# Patient Record
Sex: Female | Born: 1985 | Race: Black or African American | Hispanic: No | Smoking: Never smoker
Health system: Southern US, Community
[De-identification: ages and names within clinical notes are randomized; demographics above are authoritative.]

## PROBLEM LIST (undated history)

## (undated) DIAGNOSIS — Z789 Other specified health status: Secondary | ICD-10-CM

## (undated) HISTORY — PX: NO PAST SURGERIES: SHX2092

---

## 2013-07-05 ENCOUNTER — Emergency Department (INDEPENDENT_AMBULATORY_CARE_PROVIDER_SITE_OTHER)
Admission: EM | Admit: 2013-07-05 | Discharge: 2013-07-05 | Disposition: A | Discharge status: Home / Self Care | Payer: Self-pay | Source: Home / Self Care | Attending: Family Medicine | Admitting: Family Medicine

## 2013-07-05 ENCOUNTER — Other Ambulatory Visit (HOSPITAL_COMMUNITY)
Admission: RE | Admit: 2013-07-05 | Discharge: 2013-07-05 | Disposition: A | Discharge status: Home / Self Care | Payer: Self-pay | Source: Ambulatory Visit | Attending: Family Medicine | Admitting: Family Medicine

## 2013-07-05 DIAGNOSIS — N760 Acute vaginitis: Secondary | ICD-10-CM | POA: Insufficient documentation

## 2013-07-05 DIAGNOSIS — Z113 Encounter for screening for infections with a predominantly sexual mode of transmission: Secondary | ICD-10-CM | POA: Insufficient documentation

## 2013-07-05 LAB — RPR

## 2013-07-05 LAB — POCT PREGNANCY, URINE: Preg Test, Ur: NEGATIVE

## 2013-07-05 LAB — HIV ANTIBODY (ROUTINE TESTING W REFLEX): HIV: NONREACTIVE

## 2013-07-05 MED ORDER — LIDOCAINE HCL (PF) 1 % IJ SOLN
INTRAMUSCULAR | Status: AC
  Filled 2013-07-05: qty 5

## 2013-07-05 MED ORDER — AZITHROMYCIN 250 MG PO TABS
1000.0000 mg | ORAL_TABLET | Freq: Once | ORAL | Status: AC
  Administered 2013-07-05: 1000 mg via ORAL

## 2013-07-05 MED ORDER — CEFTRIAXONE SODIUM 250 MG IJ SOLR
250.0000 mg | Freq: Once | INTRAMUSCULAR | Status: AC
  Administered 2013-07-05: 250 mg via INTRAMUSCULAR

## 2013-07-05 MED ORDER — CEFTRIAXONE SODIUM 250 MG IJ SOLR
INTRAMUSCULAR | Status: AC
  Filled 2013-07-05: qty 250

## 2013-07-05 MED ORDER — AZITHROMYCIN 250 MG PO TABS
ORAL_TABLET | ORAL | Status: AC
  Filled 2013-07-05: qty 4

## 2013-07-05 NOTE — ED Provider Notes (Signed)
Mary Mckinney is a 28 y.o. female who presents to Urgent Care today for Chlamydia. Patient's ex-boyfriend recently called her and told her that he tested positive for Chlamydia. She is completely asymptomatic and feels well. She would like testing and treatment today.   No past medical history on file. History  Substance Use Topics  . Smoking status: Not on file  . Smokeless tobacco: Not on file  . Alcohol Use: Not on file   ROS as above Medications: No current facility-administered medications for this encounter.   No current outpatient prescriptions on file.    Exam:  BP 144/81  Pulse 59  Temp(Src) 98.8 F (37.1 C) (Oral)  Resp 18  SpO2 100% Gen: Well NAD   Results for orders placed during the hospital encounter of 07/05/13 (from the past 24 hour(s))  POCT PREGNANCY, URINE     Status: None   Collection Time    07/05/13 12:59 PM      Result Value Ref Range   Preg Test, Ur NEGATIVE  NEGATIVE   No results found.  Assessment and Plan: 28 y.o. female with Chlamydia. Plan to treat with azithromycin and ceftriaxone. Urine cytology pending. HIV and RPR pending  Discussed warning signs or symptoms. Please see discharge instructions. Patient expresses understanding.    Gregor Hams, MD 07/05/13 [phone]

## 2013-07-05 NOTE — Discharge Instructions (Signed)
Thank you for coming in today. We will call you if anything is positive.  If your belly pain worsens, or you have high fever, bad vomiting, blood in your stool or black tarry stool go to the Emergency Room.   Chlamydia Test This a test to see if you have Chlamydia which is a common sexually transmitted disease (STD). You get this disease by having sexual contact (oral, vaginal, or anal) with an infected person. An infected mother can spread the disease to her baby during childbirth. If this test is positive, you have a sexually transmitted disease (STD). DO NOT have unprotected sex until the treatment is complete and you have been retested and are negative. PREPARATION FOR TEST Your caregiver will use a swab to take a sample. The swab may be from the cervix, urethra, penis, anus, or throat. It may be possible to use a urine sample, if the lab where the sample is sent is able to test urine for this disease. NORMAL FINDINGS  No Growth  Antibodies: less than 1:640 Ranges for normal findings may vary among different laboratories and hospitals. You should always check with your doctor after having lab work or other tests done to discuss the meaning of your test results and whether your values are considered within normal limits. MEANING OF TEST  Your caregiver will go over the test results with you and discuss the importance and meaning of your results, as well as treatment options and the need for additional tests if necessary. OBTAINING THE TEST RESULTS It is your responsibility to obtain your test results. Ask the lab or department performing the test when and how you will get your results. Document Released: 02/06/2004 Document Revised: 04/07/2011 Document Reviewed: 12/23/2007 [facility] Patient Information 2014 [place], [place].

## 2013-07-07 ENCOUNTER — Telehealth (HOSPITAL_COMMUNITY): Payer: Self-pay | Admitting: *Deleted

## 2013-09-07 ENCOUNTER — Emergency Department (HOSPITAL_COMMUNITY)
Admission: EM | Admit: 2013-09-07 | Discharge: 2013-09-07 | Disposition: A | Discharge status: Home / Self Care | Payer: BC Managed Care – PPO | Attending: Emergency Medicine | Admitting: Emergency Medicine

## 2013-09-07 ENCOUNTER — Encounter (HOSPITAL_COMMUNITY): Payer: Self-pay | Admitting: Emergency Medicine

## 2013-09-07 DIAGNOSIS — L03115 Cellulitis of right lower limb: Secondary | ICD-10-CM

## 2013-09-07 DIAGNOSIS — L03119 Cellulitis of unspecified part of limb: Principal | ICD-10-CM

## 2013-09-07 DIAGNOSIS — R21 Rash and other nonspecific skin eruption: Secondary | ICD-10-CM | POA: Diagnosis present

## 2013-09-07 DIAGNOSIS — L02419 Cutaneous abscess of limb, unspecified: Secondary | ICD-10-CM | POA: Diagnosis not present

## 2013-09-07 MED ORDER — DOXYCYCLINE HYCLATE 100 MG PO CAPS: 100.0000 mg | ORAL_CAPSULE | Freq: Two times a day (BID) | ORAL | Status: DC

## 2013-09-07 NOTE — ED Provider Notes (Signed)
CSN: [id number]     Arrival date & time 09/07/13  0433 History   First MD Initiated Contact with Patient 09/07/13 [phone]     Chief Complaint  Patient presents with  . Insect Bite      Patient is a 28 y.o. female presenting with rash. The history is provided by the patient.  Rash Location:  Leg Leg rash location:  R lower leg Severity:  Mild Onset quality:  Gradual Duration:  1 day Timing:  Constant Progression:  Worsening Chronicity:  New Relieved by:  Nothing Worsened by:  Nothing tried Associated symptoms: no fever and no shortness of breath   pt reports right calf pain/redness for past 24 hours She reports it is also "itching" She thinks it is a spider bite  She has no medical problems.  No h/o DVT/PE   PMH - none  History  Substance Use Topics  . Smoking status: Never Smoker   . Smokeless tobacco: Never Used  . Alcohol Use: No   OB History   Grav Para Term Preterm Abortions TAB SAB Ect Mult Living                 Review of Systems  Constitutional: Negative for fever.  Respiratory: Negative for shortness of breath.   Cardiovascular: Negative for chest pain.  Skin: Positive for rash.      Allergies  Review of patient's allergies indicates no known allergies.  Home Medications   Prior to Admission medications   Medication Sig Start Date End Date Taking? Authorizing Provider  doxycycline (VIBRAMYCIN) 100 MG capsule Take 1 capsule (100 mg total) by mouth 2 (two) times daily. 09/07/13   Sharyon Cable, MD   BP 129/66  Pulse 78  Temp(Src) 98.3 F (36.8 C) (Oral)  Resp 18  SpO2 97%  LMP 08/27/2013 Physical Exam CONSTITUTIONAL: Well developed/well nourished HEAD: Normocephalic/atraumatic ENMT: Mucous membranes moist NECK: supple no meningeal signs CV: S1/S2 noted, no murmurs/rubs/gallops noted LUNGS: Lungs are clear to auscultation bilaterally, no apparent distress ABDOMEN: soft, nontender, no rebound or guarding NEURO: Pt is awake/alert, moves  all extremitiesx4 EXTREMITIES: pulses normal, full ROM Patch of erythema to posterior calf.  No crepitus.  No streaking.  No abscess.  No induration.  No streaking.  Mild tenderness to area of rash.  No other calf tenderness SKIN: warm, color normal PSYCH: no abnormalities of mood noted  ED Course  Procedures   Suspect early cellulitis, though could also have some allergic features I doubt acute DVT We discussed strict return precautions  MDM   Final diagnoses:  Cellulitis of right lower extremity    Nursing notes including past medical history and social history reviewed and considered in documentation     Sharyon Cable, MD 09/07/13 [phone]

## 2013-09-07 NOTE — ED Notes (Signed)
Pt states her rt calf started swelling yesterday morning and she thinks it is a spider bite. Leg is swollen and red and warm to the touch on the rt calf.

## 2015-01-18 LAB — OB RESULTS CONSOLE ABO/RH: RH Type: POSITIVE

## 2015-01-18 LAB — OB RESULTS CONSOLE GC/CHLAMYDIA
Chlamydia: NEGATIVE
GC PROBE AMP, GENITAL: NEGATIVE

## 2015-01-18 LAB — OB RESULTS CONSOLE HIV ANTIBODY (ROUTINE TESTING): HIV: NONREACTIVE

## 2015-01-18 LAB — OB RESULTS CONSOLE HEPATITIS B SURFACE ANTIGEN: HEP B S AG: NEGATIVE

## 2015-01-18 LAB — OB RESULTS CONSOLE ANTIBODY SCREEN: ANTIBODY SCREEN: NEGATIVE

## 2015-01-18 LAB — OB RESULTS CONSOLE RUBELLA ANTIBODY, IGM: RUBELLA: IMMUNE

## 2015-01-18 LAB — OB RESULTS CONSOLE RPR: RPR: NONREACTIVE

## 2015-01-28 NOTE — L&D Delivery Note (Signed)
Final Labor Progress Note At 0320 pt reports an increased in rectal pressure.  VE C/C/+2.  FHR remained reassuring with early decelerations.  Vaginal Delivery Note The pt utilized an epidural as pain management.  Artificial rupture of membranes on 06/20/15, at 1232, light leconium.  GBS was positive, PCN x 5 doses were given.  Cervical dilation was complete at  0320.  NICHD Category 1.    Pushing with guidance began at  0325.   After 19 minutes of pushing the head, shoulders and the body of a viable female infant "[place]" delivered spontaneously with maternal effort and compound right hand in the ROA position at 0344.       Mild shoulder, was relieved with gentle outward traction, maternal effort, the head of the bed lowered and [place] and suprapubic pressure applied. .    Loose  Body cord x1 infant somersaulted thru without difficulty.   With vigorous tone and spontaneous cry, the infant was placed on moms abd.  After the umbilical cord was clamped it was cut by the FOB, then cord blood was obtained for evaluation.  Spontaneous delivery of a intact placenta with a 3 vessel cord via [place] at  [phone].   Episiotomy: None   The vulva, perineum, vaginal vault, rectum and cervix were inspected and revealed left labial laceration repaired using a 3-0 vicryl on a CT needle and a superficial left periurethral laceration which was repaired using a 4-0 vicryl on a SH.  Lidocaine was not used, the epidural was sufficient for the repair. The rectum sphincter intact after the repair.   Patient tolerated repair well.   Postpartum pitocin as ordered.  Fundus firm, lochia minimum, bleeding under control.   EBL 250, Pt hemodynamically stable.   Sponge, laps and needle count correct and verified with the primary care nurse.  Attending MD available at all times.    Routine postpartum orders   Mother unsure about method of contraception  Mom plans to breastfeed.    Placenta to pathology: NO      Cord Gases sent to lab: NO Cord blood sent to lab: YES   APGARS:  8 at 1 minute and 9 at 5 minutes Weight:. 7lbs 8.8 oz   Both mom and baby were left in stable condition, baby skin to skin.    Lavetta Nielsen, CNM, MSN 06/21/2015. 4:37 AM

## 2015-06-04 LAB — OB RESULTS CONSOLE GBS: STREP GROUP B AG: POSITIVE

## 2015-06-19 ENCOUNTER — Encounter (HOSPITAL_COMMUNITY): Payer: Self-pay

## 2015-06-19 ENCOUNTER — Inpatient Hospital Stay (HOSPITAL_COMMUNITY)
Admission: AD | Admit: 2015-06-19 | Discharge: 2015-06-22 | DRG: 775 | Disposition: A | Discharge status: Home / Self Care | Payer: Medicaid Other | Source: Ambulatory Visit | Attending: Obstetrics and Gynecology | Admitting: Obstetrics and Gynecology

## 2015-06-19 DIAGNOSIS — O480 Post-term pregnancy: Secondary | ICD-10-CM | POA: Diagnosis present

## 2015-06-19 DIAGNOSIS — O0930 Supervision of pregnancy with insufficient antenatal care, unspecified trimester: Secondary | ICD-10-CM

## 2015-06-19 DIAGNOSIS — Z6841 Body Mass Index (BMI) 40.0 and over, adult: Secondary | ICD-10-CM

## 2015-06-19 DIAGNOSIS — O9902 Anemia complicating childbirth: Secondary | ICD-10-CM | POA: Diagnosis present

## 2015-06-19 DIAGNOSIS — O99824 Streptococcus B carrier state complicating childbirth: Secondary | ICD-10-CM | POA: Diagnosis present

## 2015-06-19 DIAGNOSIS — O99214 Obesity complicating childbirth: Secondary | ICD-10-CM | POA: Diagnosis present

## 2015-06-19 DIAGNOSIS — Z3A41 41 weeks gestation of pregnancy: Secondary | ICD-10-CM | POA: Diagnosis not present

## 2015-06-19 DIAGNOSIS — D219 Benign neoplasm of connective and other soft tissue, unspecified: Secondary | ICD-10-CM | POA: Diagnosis present

## 2015-06-19 DIAGNOSIS — B951 Streptococcus, group B, as the cause of diseases classified elsewhere: Secondary | ICD-10-CM | POA: Diagnosis present

## 2015-06-19 DIAGNOSIS — O3413 Maternal care for benign tumor of corpus uteri, third trimester: Secondary | ICD-10-CM | POA: Diagnosis present

## 2015-06-19 DIAGNOSIS — D259 Leiomyoma of uterus, unspecified: Secondary | ICD-10-CM | POA: Diagnosis present

## 2015-06-19 DIAGNOSIS — D649 Anemia, unspecified: Secondary | ICD-10-CM | POA: Diagnosis present

## 2015-06-19 HISTORY — DX: Other specified health status: Z78.9

## 2015-06-19 LAB — COMPREHENSIVE METABOLIC PANEL
ALBUMIN: 3.3 g/dL — AB (ref 3.5–5.0)
ALT: 20 U/L (ref 14–54)
ANION GAP: 9 (ref 5–15)
AST: 26 U/L (ref 15–41)
Alkaline Phosphatase: 163 U/L — ABNORMAL HIGH (ref 38–126)
BILIRUBIN TOTAL: 0.2 mg/dL — AB (ref 0.3–1.2)
BUN: 9 mg/dL (ref 6–20)
CO2: 24 mmol/L (ref 22–32)
Calcium: 9 mg/dL (ref 8.9–10.3)
Chloride: 104 mmol/L (ref 101–111)
Creatinine, Ser: 0.61 mg/dL (ref 0.44–1.00)
GFR calc Af Amer: >60 mL/min (ref >60)
GFR calc non Af Amer: >60 mL/min (ref >60)
GLUCOSE: 93 mg/dL (ref 65–99)
POTASSIUM: 3.8 mmol/L (ref 3.5–5.1)
SODIUM: 137 mmol/L (ref 135–145)
TOTAL PROTEIN: 6.6 g/dL (ref 6.5–8.1)

## 2015-06-19 LAB — CBC
HCT: 35.8 % — ABNORMAL LOW (ref 36.0–46.0)
Hemoglobin: 12.1 g/dL (ref 12.0–15.0)
MCH: 28.5 pg (ref 26.0–34.0)
MCHC: 33.8 g/dL (ref 30.0–36.0)
MCV: 84.4 fL (ref 78.0–100.0)
Platelets: 184 10*3/uL (ref 150–400)
RBC: 4.24 MIL/uL (ref 3.87–5.11)
RDW: 14.7 % (ref 11.5–15.5)
WBC: 7.9 10*3/uL (ref 4.0–10.5)

## 2015-06-19 LAB — URIC ACID: Uric Acid, Serum: 3.8 mg/dL (ref 2.3–6.6)

## 2015-06-19 LAB — LACTATE DEHYDROGENASE: LDH: 164 U/L (ref 98–192)

## 2015-06-19 MED ORDER — CITRIC ACID-SODIUM CITRATE 334-500 MG/5ML PO SOLN
30.0000 mL | ORAL | Status: DC | PRN
  Administered 2015-06-19 – 2015-06-21 (×2): 30 mL via ORAL
  Filled 2015-06-19: qty 30
  Filled 2015-06-19 (×2): qty 15

## 2015-06-19 MED ORDER — OXYTOCIN 40 UNITS IN LACTATED RINGERS INFUSION - SIMPLE MED
2.5000 [IU]/h | INTRAVENOUS | Status: DC
  Administered 2015-06-21: 2.5 [IU]/h via INTRAVENOUS
  Filled 2015-06-19: qty 1000

## 2015-06-19 MED ORDER — ONDANSETRON HCL 4 MG/2ML IJ SOLN
4.0000 mg | Freq: Four times a day (QID) | INTRAMUSCULAR | Status: DC | PRN
  Administered 2015-06-20 – 2015-06-21 (×2): 4 mg via INTRAVENOUS
  Filled 2015-06-19 (×2): qty 2

## 2015-06-19 MED ORDER — FLEET ENEMA 7-19 GM/118ML RE ENEM: 1.0000 | ENEMA | RECTAL | Status: DC | PRN

## 2015-06-19 MED ORDER — TERBUTALINE SULFATE 1 MG/ML IJ SOLN
0.2500 mg | Freq: Once | INTRAMUSCULAR | Status: DC | PRN
  Filled 2015-06-19: qty 1

## 2015-06-19 MED ORDER — MISOPROSTOL 25 MCG QUARTER TABLET
25.0000 ug | ORAL_TABLET | ORAL | Status: DC | PRN
  Administered 2015-06-19 – 2015-06-20 (×2): 25 ug via VAGINAL
  Filled 2015-06-19: qty 1
  Filled 2015-06-19 (×2): qty 0.25

## 2015-06-19 MED ORDER — ACETAMINOPHEN 325 MG PO TABS: 650.0000 mg | ORAL_TABLET | ORAL | Status: DC | PRN

## 2015-06-19 MED ORDER — FENTANYL CITRATE (PF) 100 MCG/2ML IJ SOLN: 100.0000 ug | INTRAMUSCULAR | Status: DC | PRN

## 2015-06-19 MED ORDER — LACTATED RINGERS IV SOLN
INTRAVENOUS | Status: DC
  Administered 2015-06-19 – 2015-06-20 (×4): via INTRAVENOUS

## 2015-06-19 MED ORDER — OXYTOCIN BOLUS FROM INFUSION: 500.0000 mL | INTRAVENOUS | Status: DC

## 2015-06-19 MED ORDER — ZOLPIDEM TARTRATE 5 MG PO TABS: 5.0000 mg | ORAL_TABLET | Freq: Every evening | ORAL | Status: DC | PRN

## 2015-06-19 MED ORDER — LIDOCAINE HCL (PF) 1 % IJ SOLN
30.0000 mL | INTRAMUSCULAR | Status: DC | PRN
  Filled 2015-06-19: qty 30

## 2015-06-19 MED ORDER — PENICILLIN G POTASSIUM 5000000 UNITS IJ SOLR
5.0000 10*6.[IU] | Freq: Once | INTRAVENOUS | Status: AC
  Administered 2015-06-20: 5 10*6.[IU] via INTRAVENOUS
  Filled 2015-06-19: qty 5

## 2015-06-19 MED ORDER — OXYCODONE-ACETAMINOPHEN 5-325 MG PO TABS: 2.0000 | ORAL_TABLET | ORAL | Status: DC | PRN

## 2015-06-19 MED ORDER — PENICILLIN G POTASSIUM 5000000 UNITS IJ SOLR
2.5000 10*6.[IU] | INTRAVENOUS | Status: DC
  Administered 2015-06-20 – 2015-06-21 (×4): 2.5 10*6.[IU] via INTRAVENOUS
  Filled 2015-06-19 (×9): qty 2.5

## 2015-06-19 MED ORDER — OXYCODONE-ACETAMINOPHEN 5-325 MG PO TABS: 1.0000 | ORAL_TABLET | ORAL | Status: DC | PRN

## 2015-06-19 MED ORDER — LACTATED RINGERS IV SOLN
500.0000 mL | INTRAVENOUS | Status: DC | PRN
  Administered 2015-06-20 – 2015-06-21 (×3): 500 mL via INTRAVENOUS

## 2015-06-19 NOTE — H&P (Signed)
Mary Mckinney is a 30 y.o. female, G1P0 at 35 1/7 weeks, presenting for induction due to post dates.  Seen in office this afternoon, with cervix 1 cm, 50%, vtx, -2, with membranes swept.  Patient denies leaking, had small amount spotting after exam, and notes +FM.  Patient Active Problem List   Diagnosis Date Noted  . Post-dates pregnancy 06/20/2015  . Positive GBS test 06/20/2015  . Fibroids 06/20/2015  . Late prenatal care 06/20/2015  . BMI 40.0-44.9, adult ([place]) 06/20/2015    History of present pregnancy: Patient entered care at 19 weeks.   EDC of 06/11/15 was established by LMP   Anatomy scan:  21 weeks, with limited anatomy findings, 2 small fibroids and right ovarian cyst.   Additional [place] evaluations:  24 weeks, for completion of anatomy--EFW 1+9, 31%ile, completion of anatomy.   Significant prenatal events:  Late to care at 19 weeks, no issues during pregnancy  Last evaluation:  Today, with cervix 1 cm, 50%, membranes swept.    OB History    Gravida Para Term Preterm AB TAB SAB Ectopic Multiple Living   1              Past Medical History  Diagnosis Date  . Medical history non-contributory    Past Surgical History  Procedure Laterality Date  . No past surgeries     Family History: Cousin with breast cancer; Mother [place] trait; Sister Frederika disease  Social History:  reports that she has never smoked. She has never used smokeless tobacco. She reports that she does not drink alcohol or use illicit drugs.  Patient is African American, of the [employer], has some college, employed as Quarry manager, single, with FOB, [employer].   Prenatal Transfer Tool  Maternal Diabetes: No Genetic Screening: Normal Quad screen Maternal Ultrasounds/Referrals: Normal Fetal Ultrasounds or other Referrals:  None Maternal Substance Abuse:  No Significant Maternal Medications:  None Significant Maternal Lab Results: Lab values include: Group B Strep positive  TDAP 04/02/15 Flu declined  ROS:   +FM, occasional UCs  No Known Allergies   Dilation: 1 Effacement (%): 50 Station: -1 Exam by:: Donnel Saxon, CNM Blood pressure 147/85, pulse 81, temperature 98.1 F (36.7 C), temperature source Oral, height 5\' 4"  (1.626 m), weight 116.121 kg (256 lb), last menstrual period 09/05/2014.  Chest clear Heart RRR without murmur Abd gravid, NT, FH 40 cm Pelvic: As above, posterior Ext: DTR 2+, no clonus, trace edema  FHR: Category 1 UCs: Occasional, mild  Prenatal labs: ABO, Rh: O/Positive/-- (12/22 0000)O+ Antibody: Negative (12/22 0000)Neg Rubella:  Immune RPR: Nonreactive (12/22 0000)NR  HBsAg: Negative (12/22 0000) Neg HIV: Non-reactive (12/22 0000) NR GBS: Positive (05/08 0000)Positive on urine culture 06/04/15 Sickle cell/Hgb electrophoresis: AA Pap:  12/2014 GC:  Negative 01/18/15 Chlamydia: Negative 01/18/15 Genetic screenings:  Quad screen WNL Glucola: WNL Other:   Hgb 12.3 at NOB, 12.2 at 28 weeks       Assessment/Plan: IUP at 41 1/7 weeks GBS positive Unfavorable cervix Uterine fibroids Hx 2nd trimester SAB BMI 43.9  Plan: Admit to [facility] per consult with Dr. Charlesetta Garibaldi Routine [facility] orders Pain med/epidural prn PCN G for GBS prophylaxis  Risks and benefits of induction were reviewed, including failure of method, prolonged labor, need for further intervention, risk of cesarean.  Patient and family seem to understand these risks and wish to proceed. Anticipate cytotech use as initial measure, then foley and/or pitocin as appropriate.  Donnel Saxon, CNM, [place] 06/20/2015, 12:25 AM  Addendum:   Cytotech 25 mcg placed in posterior fornix at 2335 by RN (I was in a delivery). Will defer GBS prophylaxis until onset of active labor or ROM.  Donnel Saxon ,CNM 06/20/15 12:27a

## 2015-06-20 ENCOUNTER — Encounter (HOSPITAL_COMMUNITY): Payer: Self-pay | Admitting: Anesthesiology

## 2015-06-20 ENCOUNTER — Inpatient Hospital Stay (HOSPITAL_COMMUNITY): Payer: Medicaid Other | Admitting: Anesthesiology

## 2015-06-20 DIAGNOSIS — D219 Benign neoplasm of connective and other soft tissue, unspecified: Secondary | ICD-10-CM | POA: Diagnosis present

## 2015-06-20 DIAGNOSIS — O0930 Supervision of pregnancy with insufficient antenatal care, unspecified trimester: Secondary | ICD-10-CM

## 2015-06-20 DIAGNOSIS — Z6841 Body Mass Index (BMI) 40.0 and over, adult: Secondary | ICD-10-CM

## 2015-06-20 DIAGNOSIS — O480 Post-term pregnancy: Secondary | ICD-10-CM | POA: Diagnosis present

## 2015-06-20 DIAGNOSIS — B951 Streptococcus, group B, as the cause of diseases classified elsewhere: Secondary | ICD-10-CM | POA: Diagnosis present

## 2015-06-20 LAB — TYPE AND SCREEN
ABO/RH(D): O POS
Antibody Screen: NEGATIVE

## 2015-06-20 LAB — ABO/RH: ABO/RH(D): O POS

## 2015-06-20 LAB — PROTEIN / CREATININE RATIO, URINE
Creatinine, Urine: 178 mg/dL
Protein Creatinine Ratio: 0.1 mg/mg{Cre} (ref 0.00–0.15)
Total Protein, Urine: 18 mg/dL

## 2015-06-20 MED ORDER — EPHEDRINE 5 MG/ML INJ
10.0000 mg | INTRAVENOUS | Status: DC | PRN
  Filled 2015-06-20: qty 2

## 2015-06-20 MED ORDER — PHENYLEPHRINE 40 MCG/ML (10ML) SYRINGE FOR IV PUSH (FOR BLOOD PRESSURE SUPPORT)
80.0000 ug | PREFILLED_SYRINGE | INTRAVENOUS | Status: DC | PRN
  Filled 2015-06-20: qty 5
  Filled 2015-06-20: qty 10

## 2015-06-20 MED ORDER — OXYTOCIN 40 UNITS IN LACTATED RINGERS INFUSION - SIMPLE MED
1.0000 m[IU]/min | INTRAVENOUS | Status: DC
  Administered 2015-06-20: 2 m[IU]/min via INTRAVENOUS

## 2015-06-20 MED ORDER — FENTANYL 2.5 MCG/ML BUPIVACAINE 1/10 % EPIDURAL INFUSION (WH - ANES)
14.0000 mL/h | INTRAMUSCULAR | Status: DC | PRN
  Administered 2015-06-20 (×2): 14 mL/h via EPIDURAL
  Filled 2015-06-20 (×2): qty 125

## 2015-06-20 MED ORDER — LIDOCAINE HCL (PF) 1 % IJ SOLN
INTRAMUSCULAR | Status: DC | PRN
  Administered 2015-06-20: 7 mL via EPIDURAL
  Administered 2015-06-20: 8 mL via EPIDURAL

## 2015-06-20 MED ORDER — PHENYLEPHRINE 40 MCG/ML (10ML) SYRINGE FOR IV PUSH (FOR BLOOD PRESSURE SUPPORT)
80.0000 ug | PREFILLED_SYRINGE | INTRAVENOUS | Status: DC | PRN
  Filled 2015-06-20: qty 5

## 2015-06-20 MED ORDER — LACTATED RINGERS IV SOLN
500.0000 mL | Freq: Once | INTRAVENOUS | Status: AC
  Administered 2015-06-20: 1000 mL via INTRAVENOUS

## 2015-06-20 MED ORDER — DIPHENHYDRAMINE HCL 50 MG/ML IJ SOLN: 12.5000 mg | INTRAMUSCULAR | Status: DC | PRN

## 2015-06-20 NOTE — Progress Notes (Signed)
Mary Mckinney is a 30 y.o. G1P0 at [redacted]w[redacted]d   Subjective: No complaints.  A little bloody show and pt s/p episode of vomiting.  Objective: BP 141/77 mmHg  Pulse 68  Temp(Src) 98 F (36.7 C) (Oral)  Resp 18  Ht 5\' 4"  (1.626 m)  Wt 256 lb (116.121 kg)  BMI 43.92 kg/m2  SpO2 97%  LMP 09/05/2014 I/O last 3 completed shifts: In: -  Out: 500 [Urine:500]    FHT:  FHR: 120s-130s bpm, variability: moderate,  accelerations:  Present,  decelerations:  Absent UC:   regular, every 2 minutes SVE:   Dilation: 4.5 Effacement (%): 80 Station: -1 Exam by:: Dr. Mancel Bale at 6:15p  Labs: Lab Results  Component Value Date   WBC 7.9 06/19/2015   HGB 12.1 06/19/2015   HCT 35.8* 06/19/2015   MCV 84.4 06/19/2015   PLT 184 06/19/2015    Assessment / Plan: Augmentation of labor, progressing well  Labor: Progressing normally likely in latent phase Preeclampsia:  no signs Fetal Wellbeing:  Category I Pain Control:  Epidural I/D:  GBS + on PCN Anticipated MOD:  NSVD  Wise Fees Y 06/20/2015, 7:02 PM

## 2015-06-20 NOTE — Progress Notes (Signed)
  Subjective: Resting at intervals.  Mother-in-law and husband at bedside.  Objective: BP 131/76 mmHg  Pulse 60  Temp(Src) 97.7 F (36.5 C) (Oral)  Resp 16  Ht 5\' 4"  (1.626 m)  Wt 116.121 kg (256 lb)  BMI 43.92 kg/m2  LMP 09/05/2014      FHT: Category 1 UC:   Irregular, mild SVE:   Dilation: 1 Effacement (%): 50 Station: -1 Exam by:: L.Stubbs, RN--posterior and softening. Cytotech #2 placed in posterior fornix by RN  Assessment:  Induction for post-dates GBS positive  Plan: Continue cervical ripening.  Donnel Saxon CNM 06/20/2015, 3:48 AM

## 2015-06-20 NOTE — Anesthesia Procedure Notes (Signed)
Epidural Patient location during procedure: OB Start time: 06/20/2015 2:16 PM End time: 06/20/2015 2:20 PM  Staffing Anesthesiologist: Lyn Hollingshead Performed by: anesthesiologist   Preanesthetic Checklist Completed: patient identified, surgical consent, pre-op evaluation, timeout performed, IV checked, risks and benefits discussed and monitors and equipment checked  Epidural Patient position: sitting Prep: site prepped and draped and DuraPrep Patient monitoring: continuous pulse ox and blood pressure Approach: midline Location: L3-L4 Injection technique: LOR air  Needle:  Needle type: Tuohy  Needle gauge: 17 G Needle length: 9 cm and 9 Needle insertion depth: 8 cm Catheter type: closed end flexible Catheter size: 19 Gauge Catheter at skin depth: 14 cm Test dose: negative and Other  Assessment Sensory level: T9 Events: blood not aspirated, injection not painful, no injection resistance, negative IV test and no paresthesia  Additional Notes Reason for block:procedure for pain

## 2015-06-20 NOTE — Anesthesia Preprocedure Evaluation (Addendum)
Anesthesia Evaluation  Patient identified by MRN, date of birth, ID band Patient awake    Reviewed: Allergy & Precautions, H&P , NPO status , Patient's Chart, lab work & pertinent test results  Airway Mallampati: II  TM Distance: >3 FB Neck ROM: full    Dental no notable dental hx.    Pulmonary neg pulmonary ROS,    Pulmonary exam normal        Cardiovascular negative cardio ROS Normal cardiovascular exam     Neuro/Psych negative neurological ROS  negative psych ROS   GI/Hepatic negative GI ROS, Neg liver ROS,   Endo/Other  Morbid obesity  Renal/GU negative Renal ROS     Musculoskeletal   Abdominal (+) + obese,   Peds  Hematology negative hematology ROS (+)   Anesthesia Other Findings   Reproductive/Obstetrics (+) Pregnancy                             Anesthesia Physical Anesthesia Plan  ASA: III  Anesthesia Plan:    Post-op Pain Management:    Induction:   Airway Management Planned:   Additional Equipment:   Intra-op Plan:   Post-operative Plan:   Informed Consent: I have reviewed the patients History and Physical, chart, labs and discussed the procedure including the risks, benefits and alternatives for the proposed anesthesia with the patient or authorized representative who has indicated his/her understanding and acceptance.     Plan Discussed with:   Anesthesia Plan Comments:       Anesthesia Quick Evaluation

## 2015-06-20 NOTE — Progress Notes (Signed)
Mary Mckinney is a 30 y.o. G1P0 at [redacted]w[redacted]d   Subjective: No complaints  Objective: BP 130/65 mmHg  Pulse 56  Temp(Src) 98 F (36.7 C) (Oral)  Resp 16  Ht 5\' 4"  (1.626 m)  Wt 256 lb (116.121 kg)  BMI 43.92 kg/m2  SpO2 97%  LMP 09/05/2014      FHT:  FHR: 130s bpm, variability: moderate,  accelerations:  Present,  decelerations:  Absent UC:   Not picking up well (will plan IUPC at next exam if readjusting monitor not working) SVE:   Dilation: 3.5 Effacement (%): 70, 80 Station: -1 Exam by:: Mary Sar RN  Labs: Lab Results  Component Value Date   WBC 7.9 06/19/2015   HGB 12.1 06/19/2015   HCT 35.8* 06/19/2015   MCV 84.4 06/19/2015   PLT 184 06/19/2015    Assessment / Plan: Augmentation of labor, progressing well  Labor: Progressing normally Preeclampsia:  no signs Fetal Wellbeing:  Category I Pain Control:  Epidural I/D:  GBS positive Anticipated MOD:  NSVD  Mary Mckinney Y 06/20/2015, 3:37 PM

## 2015-06-20 NOTE — Anesthesia Pain Management Evaluation Note (Signed)
  CRNA Pain Management Visit Note  Patient: Mary Mckinney, 30 y.o., female  "Hello I am a member of the anesthesia team at [facility]. We have an anesthesia team available at all times to provide care throughout the hospital, including epidural management and anesthesia for C-section. I don't know your plan for the delivery whether it a natural birth, water birth, IV sedation, nitrous supplementation, doula or epidural, but we want to meet your pain goals."   1.Was your pain managed to your expectations on prior hospitalizations?   Yes   2.What is your expectation for pain management during this hospitalization?     Epidural  3.How can we help you reach that goal? epidural  Record the patient's initial score and the patient's pain goal.   Pain: 2  Pain Goal: 7 [facility] wants you to be able to say your pain was always managed very well.  Willa Rough 06/20/2015

## 2015-06-20 NOTE — Progress Notes (Signed)
Mary Mckinney is a 30 y.o. G1P0 at [redacted]w[redacted]d admitted for post dates induction  Subjective: Comfortable but feeling contractions q 79min or so  Objective: BP 128/81 mmHg  Pulse 63  Temp(Src) 98.5 F (36.9 C) (Oral)  Resp 16  Ht 5\' 4"  (1.626 m)  Wt 256 lb (116.121 kg)  BMI 43.92 kg/m2  LMP 09/05/2014      FHT:  FHR: 140s-150s bpm, variability: moderate,  accelerations:  Present,  decelerations:  Absent UC:   regular, every 3-4 minutes SVE:   Dilation: 1.5 Effacement (%): 50 Station: -1 Exam by:: Lina Sar RN  Labs: Lab Results  Component Value Date   WBC 7.9 06/19/2015   HGB 12.1 06/19/2015   HCT 35.8* 06/19/2015   MCV 84.4 06/19/2015   PLT 184 06/19/2015    Assessment / Plan: Labor induction on pitocin  Labor: Cont pitocin Preeclampsia:  no signs Fetal Wellbeing:  Category I Pain Control:  currently none needed I/D:  GBS + - will start abx now Anticipated MOD:  NSVD  Libi Corso Y 06/20/2015, 11:14 AM

## 2015-06-20 NOTE — Progress Notes (Signed)
Mary Mckinney is a 30 y.o. G1P0 at [redacted]w[redacted]d   Subjective: No complaints  Objective: BP 123/79 mmHg  Pulse 59  Temp(Src) 98 F (36.7 C) (Oral)  Resp 18  Ht 5\' 4"  (1.626 m)  Wt 256 lb (116.121 kg)  BMI 43.92 kg/m2  SpO2 97%  LMP 09/05/2014      FHT:  FHR: 140s bpm, variability: min-mod,  accelerations:  Present,  decelerations:  Present ocassionally but mostly none UC:   regular, every 3-4 minutes SVE:   Dilation: 4 Effacement (%): 80 Station: -1 Exam by:: Dr. Mancel Bale Contractions not picking up well - IUPC placed  Labs: Lab Results  Component Value Date   WBC 7.9 06/19/2015   HGB 12.1 06/19/2015   HCT 35.8* 06/19/2015   MCV 84.4 06/19/2015   PLT 184 06/19/2015    Assessment / Plan: Augmentation of labor, progressing well  Labor: Progressing normally Preeclampsia:  no signs Fetal Wellbeing:  Category I Pain Control:  Epidural I/D:  GBS + on pcn Anticipated MOD:  NSVD  Mary Mckinney Y 06/20/2015, 5:03 PM

## 2015-06-20 NOTE — Progress Notes (Signed)
Subjective: Comfortable with epidural Objective: BP 129/62 mmHg  Pulse 81  Temp(Src) 98.3 F (36.8 C) (Axillary)  Resp 20  Ht 5\' 4"  (1.626 m)  Wt 116.121 kg (256 lb)  BMI 43.92 kg/m2  SpO2 97%  LMP 09/05/2014 I/O last 3 completed shifts: In: -  Out: 500 [Urine:500]    FHT: Category 1, 145 bpm, moderate variability, +accels, early decelerations UC:   regular, every 1-3  minutes SVE:   Dilation: 5.5 Effacement (%): 90 Station: 0 Exam by:: Lavetta Nielsen CNM Membranes: AROM at 1232 on 5/24 Induction/Augmentation: Cytotec: S/p x 2 doses, last dose 0335, 06/20/15 Pitocin: 16 mu/min Internal monitors: IUPC, Mvus 190  GBS prophylaxis : x3 doses, last @ [id number], 06/20/15  Pain management: Epidural @ 1420, 06/20/15  Assessment:  IUP at 41.1 wks Early/Active labor Adequate Mvus Cat 1 FT GBS positive  Plan: Continue induction of labor Continue pitocin induction  per protocol Desires Mvus 180-220 , hold increase in pitocin for Mvus Greater than 190-200 Encourage frequent changes in position to facilitate fetal rotation and descent Dr. Mancel Bale updated on progress  [place], [place] 06/20/2015, 11:07 PM

## 2015-06-20 NOTE — Progress Notes (Addendum)
Subjective: Has rested at intervals.  Objective: BP 130/72 mmHg  Pulse 51  Temp(Src) 97.9 F (36.6 C) (Oral)  Resp 16  Ht 5\' 4"  (1.626 m)  Wt 116.121 kg (256 lb)  BMI 43.92 kg/m2  LMP 09/05/2014  \ Filed Vitals:   06/19/15 2218 06/20/15 0136 06/20/15 0344 06/20/15 0612  BP: 147/85 128/76 131/76 130/72  Pulse: 81 63 60 51  Temp: 98.1 F (36.7 C) 98 F (36.7 C) 97.7 F (36.5 C) 97.9 F (36.6 C)  TempSrc: Oral Oral Oral   Resp:  16 16   Height: 5\' 4"  (1.626 m)     Weight: 116.121 kg (256 lb)           Results for orders placed or performed during the hospital encounter of 06/19/15 (from the past 24 hour(s))  CBC     Status: Abnormal   Collection Time: 06/19/15 11:05 PM  Result Value Ref Range   WBC 7.9 4.0 - 10.5 K/uL   RBC 4.24 3.87 - 5.11 MIL/uL   Hemoglobin 12.1 12.0 - 15.0 g/dL   HCT 35.8 (L) 36.0 - 46.0 %   MCV 84.4 78.0 - 100.0 fL   MCH 28.5 26.0 - 34.0 pg   MCHC 33.8 30.0 - 36.0 g/dL   RDW 14.7 11.5 - 15.5 %   Platelets 184 150 - 400 K/uL  Type and screen [place]     Status: None   Collection Time: 06/19/15 11:05 PM  Result Value Ref Range   ABO/RH(D) O POS    Antibody Screen NEG    Sample Expiration 06/22/2015   Comprehensive metabolic panel     Status: Abnormal   Collection Time: 06/19/15 11:05 PM  Result Value Ref Range   Sodium 137 135 - 145 mmol/L   Potassium 3.8 3.5 - 5.1 mmol/L   Chloride 104 101 - 111 mmol/L   CO2 24 22 - 32 mmol/L   Glucose, Bld 93 65 - 99 mg/dL   BUN 9 6 - 20 mg/dL   Creatinine, Ser 0.61 0.44 - 1.00 mg/dL   Calcium 9.0 8.9 - 10.3 mg/dL   Total Protein 6.6 6.5 - 8.1 g/dL   Albumin 3.3 (L) 3.5 - 5.0 g/dL   AST 26 15 - 41 U/L   ALT 20 14 - 54 U/L   Alkaline Phosphatase 163 (H) 38 - 126 U/L   Total Bilirubin 0.2 (L) 0.3 - 1.2 mg/dL   GFR calc non Af Amer >60 >60 mL/min   GFR calc Af Amer >60 >60 mL/min   Anion gap 9 5 - 15  Lactate dehydrogenase     Status: None   Collection Time: 06/19/15 11:05  PM  Result Value Ref Range   LDH 164 98 - 192 U/L  Uric acid     Status: None   Collection Time: 06/19/15 11:05 PM  Result Value Ref Range   Uric Acid, Serum 3.8 2.3 - 6.6 mg/dL  ABO/Rh     Status: None   Collection Time: 06/19/15 11:05 PM  Result Value Ref Range   ABO/RH(D) O POS   Protein / creatinine ratio, urine     Status: None   Collection Time: 06/20/15  3:34 AM  Result Value Ref Range   Creatinine, Urine 178.00 mg/dL   Total Protein, Urine 18 mg/dL   Protein Creatinine Ratio 0.10 0.00 - 0.15 mg/mg[Cre]    FHT: Category 1 UC:   irregular, every 2-5 minutes, SVE:   Dilation: 1 Effacement (%):  [place]: -1 Exam by:: L.Stubbs, RN at [phone]   Assessment:  Induction for postdates GBS positive  Plan: Continue current care. Re-evaluate cervix/labor status around 0745. Start GBS prophylaxis as labor advances.  Donnel Saxon CNM 06/20/2015, 6:31 AM

## 2015-06-20 NOTE — Progress Notes (Signed)
Mary Mckinney is a 30 y.o. G1P0 at [redacted]w[redacted]d  Subjective: Feeling ctxs slightly more  Objective: BP 133/84 mmHg  Pulse 69  Temp(Src) 98.5 F (36.9 C) (Oral)  Resp 16  Ht 5\' 4"  (1.626 m)  Wt 256 lb (116.121 kg)  BMI 43.92 kg/m2  LMP 09/05/2014      FHT:  FHR: 130s bpm, variability: moderate,  accelerations:  Present,  decelerations:  Absent UC:   regular, every 2 minutes SVE:   Dilation: 2 Effacement (%): 70, 80 Station: -1 Exam by:: Dr. Mancel Bale  Labs: Lab Results  Component Value Date   WBC 7.9 06/19/2015   HGB 12.1 06/19/2015   HCT 35.8* 06/19/2015   MCV 84.4 06/19/2015   PLT 184 06/19/2015    Assessment / Plan: Augmentation of labor, progressing well  Attempted AROM - no fluid  Labor: Progressing normally Preeclampsia:  no signs Fetal Wellbeing:  Category I Pain Control:  epidural upon request I/D:  GBS positive Anticipated MOD:  NSVD  Mary Mckinney Y 06/20/2015, 12:46 PM

## 2015-06-20 NOTE — Progress Notes (Signed)
Subjective: States she is miserable, her bottom hurts from laying in bed. Otherwise comfortable with contractions.  Some bloody show and light mec noted on pads/towels.   Objective: BP 121/70 mmHg  Pulse 62  Temp(Src) 98.3 F (36.8 C) (Axillary)  Resp 20  Ht 5\' 4"  (1.626 m)  Wt 116.121 kg (256 lb)  BMI 43.92 kg/m2  SpO2 97%  LMP 09/05/2014 I/O last 3 completed shifts: In: -  Out: 500 [Urine:500]    FHT: Category 1, 130 bpm, moderate variability, +accels, early decelerations  UC:   regular, every 2-3 minutes SVE:   Dilation: 4.5 Effacement (%): 80 Station: 0 Exam by:: Apolonio Schneiders CNM Membranes:   AROM at 1232 on 5/24 Induction/Augmentation: Cytotec:  S/p x 2 doses, last dose 0335, 06/20/15 Pitocin:  14 mu/min Internal monitors:  IUPC, Mvus 190  GBS prophylaxis :  x3 doses, last @ [id number], 06/20/15  Pain management:  Epidural @ 1420, 06/20/15  Assessment:  IUP at  41.1 wks Early labor Adequate Mvus Cat 1 FT GBS positive  Plan: Continue induction of labor Continue pitocin per protocol Encourage frequent changes in position to facilitate fetal rotation and descent   [place], [place] 06/20/2015, 8:51 PM

## 2015-06-21 ENCOUNTER — Encounter (HOSPITAL_COMMUNITY): Payer: Self-pay | Admitting: *Deleted

## 2015-06-21 LAB — HIV ANTIBODY (ROUTINE TESTING W REFLEX): HIV SCREEN 4TH GENERATION: NONREACTIVE

## 2015-06-21 LAB — CBC
HEMATOCRIT: 32.8 % — AB (ref 36.0–46.0)
Hemoglobin: 11.6 g/dL — ABNORMAL LOW (ref 12.0–15.0)
MCH: 29.9 pg (ref 26.0–34.0)
MCHC: 35.4 g/dL (ref 30.0–36.0)
MCV: 84.5 fL (ref 78.0–100.0)
PLATELETS: 167 10*3/uL (ref 150–400)
RBC: 3.88 MIL/uL (ref 3.87–5.11)
RDW: 14.8 % (ref 11.5–15.5)
WBC: 14.3 10*3/uL — AB (ref 4.0–10.5)

## 2015-06-21 LAB — RPR: RPR Ser Ql: NONREACTIVE

## 2015-06-21 MED ORDER — ZOLPIDEM TARTRATE 5 MG PO TABS: 5.0000 mg | ORAL_TABLET | Freq: Every evening | ORAL | Status: DC | PRN

## 2015-06-21 MED ORDER — ONDANSETRON HCL 4 MG/2ML IJ SOLN: 4.0000 mg | INTRAMUSCULAR | Status: DC | PRN

## 2015-06-21 MED ORDER — BENZOCAINE-MENTHOL 20-0.5 % EX AERO
1.0000 "application " | INHALATION_SPRAY | CUTANEOUS | Status: DC | PRN
  Administered 2015-06-21: 1 via TOPICAL
  Filled 2015-06-21: qty 56

## 2015-06-21 MED ORDER — TETANUS-DIPHTH-ACELL PERTUSSIS 5-2.5-18.5 LF-MCG/0.5 IM SUSP: 0.5000 mL | Freq: Once | INTRAMUSCULAR | Status: DC

## 2015-06-21 MED ORDER — DIPHENHYDRAMINE HCL 25 MG PO CAPS: 25.0000 mg | ORAL_CAPSULE | Freq: Four times a day (QID) | ORAL | Status: DC | PRN

## 2015-06-21 MED ORDER — ONDANSETRON HCL 4 MG PO TABS: 4.0000 mg | ORAL_TABLET | ORAL | Status: DC | PRN

## 2015-06-21 MED ORDER — PRENATAL MULTIVITAMIN CH
1.0000 | ORAL_TABLET | Freq: Every day | ORAL | Status: DC
  Administered 2015-06-21 – 2015-06-22 (×2): 1 via ORAL
  Filled 2015-06-21 (×2): qty 1

## 2015-06-21 MED ORDER — ACETAMINOPHEN 325 MG PO TABS: 650.0000 mg | ORAL_TABLET | ORAL | Status: DC | PRN

## 2015-06-21 MED ORDER — WITCH HAZEL-GLYCERIN EX PADS: 1.0000 "application " | MEDICATED_PAD | CUTANEOUS | Status: DC | PRN

## 2015-06-21 MED ORDER — SENNOSIDES-DOCUSATE SODIUM 8.6-50 MG PO TABS
2.0000 | ORAL_TABLET | ORAL | Status: DC
  Administered 2015-06-21: 2 via ORAL
  Filled 2015-06-21: qty 2

## 2015-06-21 MED ORDER — IBUPROFEN 600 MG PO TABS
600.0000 mg | ORAL_TABLET | Freq: Four times a day (QID) | ORAL | Status: DC
  Administered 2015-06-21 – 2015-06-22 (×7): 600 mg via ORAL
  Filled 2015-06-21 (×7): qty 1

## 2015-06-21 MED ORDER — SIMETHICONE 80 MG PO CHEW: 80.0000 mg | CHEWABLE_TABLET | ORAL | Status: DC | PRN

## 2015-06-21 MED ORDER — OXYCODONE-ACETAMINOPHEN 5-325 MG PO TABS: 1.0000 | ORAL_TABLET | ORAL | Status: DC | PRN

## 2015-06-21 MED ORDER — OXYCODONE-ACETAMINOPHEN 5-325 MG PO TABS: 2.0000 | ORAL_TABLET | ORAL | Status: DC | PRN

## 2015-06-21 MED ORDER — DIBUCAINE 1 % RE OINT: 1.0000 "application " | TOPICAL_OINTMENT | RECTAL | Status: DC | PRN

## 2015-06-21 MED ORDER — COCONUT OIL OIL
1.0000 "application " | TOPICAL_OIL | Status: DC | PRN
  Administered 2015-06-22: 1 via TOPICAL
  Filled 2015-06-21: qty 120

## 2015-06-21 NOTE — Lactation Note (Signed)
This note was copied from a baby's chart. Lactation Consultation Note: Lactation Brochure given with basic teaching. Infant is 82 hours old for initial [place] visit. Mother has concerns that infant is not getting any milk from the (L) breast. Assist mother with hand expression and observed a few drops of clear colostrum. Infant was placed in football hold with good pillow support. Infant was latched on with [facility] assistance using a tea-cup hold. Infant sustained latch for 30 mins. Father of baby at bedside. Parents taught what a good deep latch looks like. Infants lips widely gaped with short burst of suckling observed. Mother informed of cue base feeding and cluster feeding. Advised to feed infant 8-12 feedings in 24/hours.  Mother informed of available [place] services.  Patient Name: Mary Mckinney [id number] Date: 06/21/2015 Reason for consult: Initial assessment   Maternal Data Has patient been taught Hand Expression?: Yes Does the patient have breastfeeding experience prior to this delivery?: No  Feeding Feeding Type: Breast Fed Length of feed: 30 min  LATCH Score/Interventions Latch: Grasps breast easily, tongue down, lips flanged, rhythmical sucking. Intervention(s): Adjust position;Breast compression  Audible Swallowing: A few with stimulation Intervention(s): Skin to skin;Hand expression  Type of Nipple: Everted at rest and after stimulation  Comfort (Breast/Nipple): Soft / non-tender     Hold (Positioning): Assistance needed to correctly position infant at breast and maintain latch. Intervention(s): Breastfeeding basics reviewed;Support Pillows;Position options;Skin to skin  LATCH Score: 8  Lactation Tools Discussed/Used     Consult Status Consult Status: Follow-up Date: 06/21/15 Follow-up type: In-patient    Jess Barters [facility] 06/21/2015, 2:56 PM

## 2015-06-21 NOTE — Anesthesia Postprocedure Evaluation (Signed)
Anesthesia Post Note  Patient: Mary Mckinney  Procedure(s) Performed: * No procedures listed *  Patient location during evaluation: Mother Baby Anesthesia Type: Epidural Level of consciousness: awake and alert Pain management: pain level controlled Vital Signs Assessment: post-procedure vital signs reviewed and stable Respiratory status: spontaneous breathing, nonlabored ventilation and respiratory function stable Cardiovascular status: stable Postop Assessment: no headache, no backache and epidural receding Anesthetic complications: no     Last Vitals:  Filed Vitals:   06/21/15 0610 06/21/15 0713  BP: 133/54 131/73  Pulse: 78 81  Temp: 36.8 C 36.9 C  Resp: 18 20    Last Pain:  Filed Vitals:   06/21/15 0717  PainSc: 0-No pain   Pain Goal:                 Riki Sheer

## 2015-06-21 NOTE — Progress Notes (Signed)
Subjective: C/o heartburn and states she feels like she needs to have a bowel movement.  Feeling more discomfort on her left side.   Objective: BP 107/62 mmHg  Pulse 82  Temp(Src) 98.2 F (36.8 C) (Oral)  Resp 18  Ht 5\' 4"  (1.626 m)  Wt 116.121 kg (256 lb)  BMI 43.92 kg/m2  SpO2 97%  LMP 09/05/2014 I/O last 3 completed shifts: In: -  Out: 500 [Urine:500]    FHT: Category 1, 145 bpm, moderate variability,  UC:   regular, every 2-3 minutes SVE:   Dilation: 9 Effacement (%): 100 Station: +1 Exam by:: Lavetta Nielsen, CNM Membranes: AROM at 1232 on 5/24 Induction/Augmentation: Cytotec: S/p x 2 doses, last dose 0335, 06/20/15 Pitocin: 10 mu/min Internal monitors: IUPC, Mvus 125   GBS prophylaxis : x4 doses, last @ 2309, 06/20/15  Pain management: Epidural @ 1420, 06/20/15  Assessment:  IUP at 41.1 wks Transitional labor Cervical change in the presence of inadequate MVus Cat 1 FT GBS positive  Plan: Continue induction of labor Continue pitocin induction per protocol Desired Mvus 180-220 , hold increase in pitocin for Mvus Greater than 190-200 Encourage frequent changes in position to facilitate fetal rotation and descent Anticipate SVD   Lavetta Nielsen CNM, [place] 06/21/2015, 2:38 AM

## 2015-06-21 NOTE — Progress Notes (Signed)
Subjective: Resting, getting impatient regarding perceived lack of progress   Objective: BP 107/62 mmHg  Pulse 82  Temp(Src) 98.2 F (36.8 C) (Oral)  Resp 18  Ht 5\' 4"  (1.626 m)  Wt 116.121 kg (256 lb)  BMI 43.92 kg/m2  SpO2 97%  LMP 09/05/2014 I/O last 3 completed shifts: In: -  Out: 500 [Urine:500]    FHT: Category 2, 145-150 bpm, min-moderate variability, +accels, early decelerations with occasional late decelerations that resolved with reduction in pitocin, maternal IV bolus and maternal position change UC:   regular, every 1-3 minutes SVE:   Dilation: 6 Effacement (%): 100, 90 Station: 0, +1 Exam by:: Lavetta Nielsen, CNM Membranes: AROM at 1232 on 5/24 Induction/Augmentation: Cytotec: S/p x 2 doses, last dose 0335, 06/20/15 Pitocin: 16 mu/min Internal monitors: IUPC  GBS prophylaxis : x4 doses, last @ 2309, 06/20/15  Pain management: Epidural @ 1420, 06/20/15  Assessment:  IUP at 41.1 wks Active labor Potential hyperstimulation  Cat 2 FT GBS positive  Plan: Continue induction of labor Decreased pitocin to 10 mu/min  Intrauterine resuscitation measures PRN, IV maternal bolus and maternal position change,  Encourage frequent changes in position to facilitate fetal rotation and descent  Lavetta Nielsen CNM, [place] 06/21/2015, 1:45 AM

## 2015-06-22 ENCOUNTER — Encounter (HOSPITAL_COMMUNITY): Payer: Self-pay | Admitting: Obstetrics and Gynecology

## 2015-06-22 MED ORDER — IBUPROFEN 800 MG PO TABS: 800.0000 mg | ORAL_TABLET | Freq: Three times a day (TID) | ORAL | Status: DC | PRN

## 2015-06-22 MED ORDER — DOCUSATE SODIUM 100 MG PO CAPS: 100.0000 mg | ORAL_CAPSULE | Freq: Two times a day (BID) | ORAL | Status: DC

## 2015-06-22 MED ORDER — OXYCODONE-ACETAMINOPHEN 5-325 MG PO TABS: 1.0000 | ORAL_TABLET | ORAL | Status: DC | PRN

## 2015-06-22 NOTE — Discharge Summary (Signed)
[facility] Discharge Summary   Patient Name:   Mary Mckinney DOB:     02/10/1985 MRN:     [id number]  Date of Admission:   06/19/2015 Date of Discharge:  06/22/2015  Admitting diagnosis:    direct admit, induction 41w Principal Problem:   Spontaneous vaginal delivery Active Problems:   Post-dates pregnancy   Positive GBS test   Fibroids   Late prenatal care   BMI 40.0-44.9, adult [facility])     Discharge diagnosis:    direct admit, induction 41w Principal Problem:   Spontaneous vaginal delivery Active Problems:   Post-dates pregnancy   Positive GBS test   Fibroids   Late prenatal care   BMI 40.0-44.9, adult (HCC) Mild shoulder dystocia Anemia                                                                     Post partum procedures: None  Type of Delivery:  Vaginal delivery  Delivering Provider: STALL, RACHEL   Date of Delivery:  06/21/2015  Newborn Data:    Live born female  Birth Weight: 7 lb 8.8 oz (3425 g) APGAR: 8, 9  Baby's Name:  [place] Baby Feeding:   Breast Disposition:   home with mother  Complications:   None  Hospital course:      Induction of Labor With Vaginal Delivery   30 y.o. yo G1P1001 at [redacted]w[redacted]d was admitted to the hospital 06/19/2015 for induction of labor.  Indication for induction: Postdates.  Patient had a prolonged, but uncomplicated labor course as follows: Membrane Rupture Time/Date: 12:35 PM ,06/20/2015   Intrapartum Procedures: Episiotomy: None [1]                                         Lacerations:  Periurethral [8]  Patient had delivery of a Viable infant.  Information for the patient's newborn:  Adriana, Mckinsey [id number]      06/21/2015  Details of delivery can be found in separate delivery note.  She had a mild shoulder dystocia. Patient had a routine postpartum course. Patient is discharged home 06/22/2015.   Physical Exam:   Filed Vitals:   06/21/15 0713 06/21/15 1258 06/21/15 1800 06/22/15 0508  BP:  131/73 132/84 115/56 110/67  Pulse: 81 81 84 70  Temp: 98.5 F (36.9 C) 97.4 F (36.3 C) 98.1 F (36.7 C) 98.9 F (37.2 C)  TempSrc: Oral Oral Oral Oral  Resp: 20 18 18 20   Height:      Weight:      SpO2: 100%      General: alert and no distress Lochia: appropriate Uterine Fundus: firm Incision: N/A DVT Evaluation: No evidence of DVT seen on physical exam.  Labs: Lab Results  Component Value Date   WBC 14.3* 06/21/2015   HGB 11.6* 06/21/2015   HCT 32.8* 06/21/2015   MCV 84.5 06/21/2015   PLT 167 06/21/2015   CMP Latest Ref Rng 06/19/2015  Glucose 65 - 99 mg/dL 93  BUN 6 - 20 mg/dL 9  Creatinine 0.44 - 1.00 mg/dL 0.61  Sodium 135 - 145 mmol/L 137  Potassium 3.5 - 5.1 mmol/L 3.8  Chloride 101 - 111 mmol/L 104  CO2 22 - 32 mmol/L 24  Calcium 8.9 - 10.3 mg/dL 9.0  Total Protein 6.5 - 8.1 g/dL 6.6  Total Bilirubin 0.3 - 1.2 mg/dL 0.2(L)  Alkaline Phos 38 - 126 U/L 163(H)  AST 15 - 41 U/L 26  ALT 14 - 54 U/L 20    Discharge instruction: per After Visit Summary and "Baby and Me Booklet".  After Visit Meds:    Medication List    TAKE these medications        calcium carbonate 500 MG chewable tablet  Commonly known as:  TUMS - dosed in mg elemental calcium  Chew 2 tablets by mouth 2 (two) times daily as needed for indigestion or heartburn.     docusate sodium 100 MG capsule  Commonly known as:  COLACE  Take 1 capsule (100 mg total) by mouth 2 (two) times daily.     ibuprofen 800 MG tablet  Commonly known as:  ADVIL,MOTRIN  Take 1 tablet (800 mg total) by mouth every 8 (eight) hours as needed.     oxyCODONE-acetaminophen 5-325 MG tablet  Commonly known as:  ROXICET  Take 1 tablet by mouth every 4 (four) hours as needed for severe pain.     ranitidine 150 MG tablet  Commonly known as:  ZANTAC  Take 150 mg by mouth 2 (two) times daily as needed for heartburn.        Diet: routine diet  Activity: Advance as tolerated. Pelvic rest for 6 weeks.    Outpatient follow up:6 weeks Follow up Appt:No future appointments. Follow up visit: No Follow-up on file.  Postpartum contraception: Mirena IUD  06/22/2015 Eli Hose, MD

## 2015-06-22 NOTE — Progress Notes (Signed)
Post Partum Day 1  Subjective: up ad lib and tolerating PO  Objective: Blood pressure 110/67, pulse 70, temperature 98.9 F (37.2 C), temperature source Oral, resp. rate 20, height 5\' 4"  (1.626 m), weight 256 lb (116.121 kg), last menstrual period 09/01/2014, SpO2 100 %, unknown if currently breastfeeding.  Physical Exam:  General: alert and no distress Lochia: appropriate Uterine Fundus: firm Incision: NA DVT Evaluation: No evidence of DVT seen on physical exam.   Recent Labs  06/19/15 2305 06/21/15 0619  HGB 12.1 11.6*  HCT 35.8* 32.8*    Assessment/Plan: Contraception IUD  Possible discharge today.   LOS: 3 days   Lawerence Dery V 06/22/2015, 4:52 PM

## 2015-06-22 NOTE — Lactation Note (Signed)
This note was copied from a baby's chart. Lactation Consultation Note  Mother states she is struggling w/ getting baby latched by herself and so she has been formula feeding. Discussed supply and demand and importance of breastfeeding often to establish her milk supply. Left [place] phone number and suggest she call for assistance w/ next feeding.  Patient Name: Mary Mckinney [id number] Date: 06/22/2015     Maternal Data    Feeding Feeding Type: Bottle Fed - Formula Nipple Type: Slow - flow Length of feed: 15 min  LATCH Score/Interventions                      Lactation Tools Discussed/Used     Consult Status      Carlye Grippe 06/22/2015, 12:26 PM

## 2015-06-22 NOTE — Discharge Instructions (Signed)
Postpartum Depression and Baby Blues °The postpartum period begins right after the birth of a baby. During this time, there is often a great amount of joy and excitement. It is also a time of many changes in the life of the parents. Regardless of how many times a mother gives birth, each child brings new challenges and dynamics to the family. It is not unusual to have feelings of excitement along with confusing shifts in moods, emotions, and thoughts. All mothers are at risk of developing postpartum depression or the "baby blues." These mood changes can occur right after giving birth, or they may occur many months after giving birth. The baby blues or postpartum depression can be mild or severe. Additionally, postpartum depression can go away rather quickly, or it can be a long-term condition.  °CAUSES °Raised hormone levels and the rapid drop in those levels are thought to be a main cause of postpartum depression and the baby blues. A number of hormones change during and after pregnancy. Estrogen and progesterone usually decrease right after the delivery of your baby. The levels of thyroid hormone and various cortisol steroids also rapidly drop. Other factors that play a role in these mood changes include major life events and genetics.  °RISK FACTORS °If you have any of the following risks for the baby blues or postpartum depression, know what symptoms to watch out for during the postpartum period. Risk factors that may increase the likelihood of getting the baby blues or postpartum depression include: °· Having a personal or family history of depression.   °· Having depression while being pregnant.   °· Having premenstrual mood issues or mood issues related to oral contraceptives. °· Having a lot of life stress.   °· Having marital conflict.   °· Lacking a social support network.   °· Having a baby with special needs.   °· Having health problems, such as diabetes.   °SIGNS AND SYMPTOMS °Symptoms of baby blues  include: °· Brief changes in mood, such as going from extreme happiness to sadness. °· Decreased concentration.   °· Difficulty sleeping.   °· Crying spells, tearfulness.   °· Irritability.   °· Anxiety.   °Symptoms of postpartum depression typically begin within the first month after giving birth. These symptoms include: °· Difficulty sleeping or excessive sleepiness.   °· Marked weight loss.   °· Agitation.   °· Feelings of worthlessness.   °· Lack of interest in activity or food.   °Postpartum psychosis is a very serious condition and can be dangerous. Fortunately, it is rare. Displaying any of the following symptoms is cause for immediate medical attention. Symptoms of postpartum psychosis include:  °· Hallucinations and delusions.   °· Bizarre or disorganized behavior.   °· Confusion or disorientation.   °DIAGNOSIS  °A diagnosis is made by an evaluation of your symptoms. There are no medical or lab tests that lead to a diagnosis, but there are various questionnaires that a health care provider may use to identify those with the baby blues, postpartum depression, or psychosis. Often, a screening tool called the Edinburgh Postnatal Depression Scale is used to diagnose depression in the postpartum period.  °TREATMENT °The baby blues usually goes away on its own in 1-2 weeks. Social support is often all that is needed. You will be encouraged to get adequate sleep and rest. Occasionally, you may be given medicines to help you sleep.  °Postpartum depression requires treatment because it can last several months or longer if it is not treated. Treatment may include individual or group therapy, medicine, or both to address any social, physiological, and psychological   factors that may play a role in the depression. Regular exercise, a healthy diet, rest, and social support may also be strongly recommended.  Postpartum psychosis is more serious and needs treatment right away. Hospitalization is often needed. HOME CARE  INSTRUCTIONS  Get as much rest as you can. Nap when the baby sleeps.   Exercise regularly. Some women find yoga and walking to be beneficial.   Eat a balanced and nourishing diet.   Do little things that you enjoy. Have a cup of tea, take a bubble bath, read your favorite magazine, or listen to your favorite music.  Avoid alcohol.   Ask for help with household chores, cooking, grocery shopping, or running errands as needed. Do not try to do everything.   Talk to people close to you about how you are feeling. Get support from your partner, family members, friends, or other new moms.  Try to stay positive in how you think. Think about the things you are grateful for.   Do not spend a lot of time alone.   Only take over-the-counter or prescription medicine as directed by your health care provider.  Keep all your postpartum appointments.   Let your health care provider know if you have any concerns.  SEEK MEDICAL CARE IF: You are having a reaction to or problems with your medicine. SEEK IMMEDIATE MEDICAL CARE IF:  You have suicidal feelings.   You think you may harm the baby or someone else. MAKE SURE YOU:  Understand these instructions.  Will watch your condition.  Will get help right away if you are not doing well or get worse.   This information is not intended to replace advice given to you by your health care provider. Make sure you discuss any questions you have with your health care provider.   Document Released: 10/18/2003 Document Revised: 01/18/2013 Document Reviewed: 10/25/2012 Elsevier Interactive Patient Education 2016 Else[employer] Vaginal Delivery, Care After Refer to this sheet in the next few weeks. These discharge instructions provide you with information on caring for yourself after delivery. Your health care provider may also give you specific instructions. Your treatment has been planned according to the most current medical practices  available, but problems sometimes occur. Call your health care provider if you have any problems or questions after you go home. HOME CARE INSTRUCTIONS  Take over-the-counter or prescription medicines only as directed by your health care provider or pharmacist.  Do not drink alcohol, especially if you are breastfeeding or taking medicine to relieve pain.  Do not chew or smoke tobacco.  Do not use illegal drugs.  Continue to use good perineal care. Good perineal care includes:  Wiping your perineum from front to back.  Keeping your perineum clean.  Do not use tampons or douche until your health care provider says it is okay.  Shower, wash your hair, and take tub baths as directed by your health care provider.  Wear a well-fitting bra that provides breast support.  Eat healthy foods.  Drink enough fluids to keep your urine clear or pale yellow.  Eat high-fiber foods such as whole grain cereals and breads, brown rice, beans, and fresh fruits and vegetables every day. These foods may help prevent or relieve constipation.  Follow your health care provider's recommendations regarding resumption of activities such as climbing stairs, driving, lifting, exercising, or traveling.  Talk to your health care provider about resuming sexual activities. Resumption of sexual activities is dependent upon your risk of infection, your rate  of healing, and your comfort and desire to resume sexual activity.  Try to have someone help you with your household activities and your newborn for at least a few days after you leave the hospital.  Rest as much as possible. Try to rest or take a nap when your newborn is sleeping.  Increase your activities gradually.  Keep all of your scheduled postpartum appointments. It is very important to keep your scheduled follow-up appointments. At these appointments, your health care provider will be checking to make sure that you are healing physically and  emotionally. SEEK MEDICAL CARE IF:   You are passing large clots from your vagina. Save any clots to show your health care provider.  You have a foul smelling discharge from your vagina.  You have trouble urinating.  You are urinating frequently.  You have pain when you urinate.  You have a change in your bowel movements.  You have increasing redness, pain, or swelling near your vaginal incision (episiotomy) or vaginal tear.  You have pus draining from your episiotomy or vaginal tear.  Your episiotomy or vaginal tear is separating.  You have painful, hard, or reddened breasts.  You have a severe headache.  You have blurred vision or see spots.  You feel sad or depressed.  You have thoughts of hurting yourself or your newborn.  You have questions about your care, the care of your newborn, or medicines.  You are dizzy or light-headed.  You have a rash.  You have nausea or vomiting.  You were breastfeeding and have not had a menstrual period within 12 weeks after you stopped breastfeeding.  You are not breastfeeding and have not had a menstrual period by the 12th week after delivery.  You have a fever. SEEK IMMEDIATE MEDICAL CARE IF:   You have persistent pain.  You have chest pain.  You have shortness of breath.  You faint.  You have leg pain.  You have stomach pain.  Your vaginal bleeding saturates two or more sanitary pads in 1 hour.   This information is not intended to replace advice given to you by your health care provider. Make sure you discuss any questions you have with your health care provider.   Document Released: 01/11/2000 Document Revised: 10/04/2014 Document Reviewed: 09/10/2011 Elsevier Interactive Patient Education [employer].

## 2015-06-22 NOTE — Lactation Note (Signed)
This note was copied from a baby's chart. Lactation Consultation Note  Mother requested help w/ breastfeeding.  Has had a difficult time latching baby and keeping her latched. Mother states baby is frantic and difficult to calm for feedings. Mother states due to difficulty mother has been giving baby formula bottles. Mother has large breasts w/ short shaft nipples.  She is able to compress breast (modified teacup) to latch baby using both hands and maintain latch after a few tries. Praised mother for her patience and encouraged her to continue breastfeeding before offering formula. Noted sucks and swallows.  Baby came off and on. Tried using a 5 FR feeding tube and syring while baby was latched but baby noticed tubing and would become unlatched. After 40 min off and on breastfeeding, demonstrated how to finger syringe feed baby.  Reviewed volume guidelines and suggest mother continue to call for help as needed.  Patient Name: Mary Mckinney [id number] Date: 06/22/2015 Reason for consult: Follow-up assessment   Maternal Data    Feeding Feeding Type: Breast Fed Length of feed: 40 min (off and on)  LATCH Score/Interventions Latch: Repeated attempts needed to sustain latch, nipple held in mouth throughout feeding, stimulation needed to elicit sucking reflex. Intervention(s): Adjust position;Assist with latch;Breast compression  Audible Swallowing: A few with stimulation Intervention(s): Skin to skin;Hand expression;Alternate breast massage  Type of Nipple: Everted at rest and after stimulation (short shaft)  Comfort (Breast/Nipple): Soft / non-tender     Hold (Positioning): Assistance needed to correctly position infant at breast and maintain latch.  LATCH Score: 7  Lactation Tools Discussed/Used     Consult Status Consult Status: Follow-up Date: 06/23/15 Follow-up type: In-patient    Vivianne Master [facility] 06/22/2015, 3:15 PM

## 2015-06-23 ENCOUNTER — Ambulatory Visit: Payer: Self-pay

## 2015-06-23 NOTE — Lactation Note (Signed)
This note was copied from a baby's chart. Lactation Consultation Note: Mother is breast and bottle feeding .she is concerned about milk coming in and causing engorgement. Suggested that mother follow guidelines in breast massage and using ice packs every 3-4 hours for 10-20 mins. This will help milk to flow .  Discussed the importance to good regular pumping if using formula. Advised mother to post pump every 2-3 hours for 15 mins. On each breast. Mother states that she has a DEBP at home. Mother also has a hand pump. Advised to breast feed infant 8-12 times in 24 hours. Mother was informed about outpatient visit to do a pre and post weigh assessment. Mother receptive to all teaching.   Patient Name: Mary Mckinney [id number] Date: 06/23/2015     Maternal Data    Feeding Feeding Type: Bottle Fed - Formula  LATCH Score/Interventions                      Lactation Tools Discussed/Used     Consult Status      Darla Lesches 06/23/2015, 10:41 AM

## 2016-12-13 ENCOUNTER — Emergency Department (HOSPITAL_COMMUNITY): Payer: Self-pay

## 2016-12-13 ENCOUNTER — Emergency Department (HOSPITAL_COMMUNITY)
Admission: EM | Admit: 2016-12-13 | Discharge: 2016-12-13 | Disposition: A | Discharge status: Home / Self Care | Payer: Self-pay | Attending: Emergency Medicine | Admitting: Emergency Medicine

## 2016-12-13 ENCOUNTER — Encounter (HOSPITAL_COMMUNITY): Payer: Self-pay

## 2016-12-13 ENCOUNTER — Other Ambulatory Visit: Payer: Self-pay

## 2016-12-13 DIAGNOSIS — J069 Acute upper respiratory infection, unspecified: Secondary | ICD-10-CM

## 2016-12-13 DIAGNOSIS — Z79899 Other long term (current) drug therapy: Secondary | ICD-10-CM | POA: Insufficient documentation

## 2016-12-13 DIAGNOSIS — J399 Disease of upper respiratory tract, unspecified: Secondary | ICD-10-CM | POA: Insufficient documentation

## 2016-12-13 NOTE — ED Provider Notes (Signed)
[facility] EMERGENCY DEPARTMENT Provider Note   CSN: [id number] Arrival date & time: 12/13/16  1738     History   Chief Complaint Chief Complaint  Patient presents with  . Shortness of Breath    HPI Mary Mckinney is a 31 y.o. female. Chief complaint is difficulty breathing.    HPI this is a 31 year old female. He states her main concern is that she can't breathe through her nose that she feels dry.  She had nasal congestion was seen in urgent care 2 days ago. Was given multiple prescriptions. Amoxicillin, albuterol, Flonase, Zyrtec. Was given IM Decadron. States that she presents today because she took Sudafed and her albuterol inhaler and felt her heart racing. She is Flonase and Zyrtec and felt like her mouth was really really dry. She follows dried point "I almost couldn't breathe through it". Prior to my evaluation here nurses place an IV and the patient was given less than 100 mL of fluid. She states this is "tremendously. States she felt "like everything popped open and I can breathe again".  No chest pain. No sputum. No leg swelling.  Past Medical History:  Diagnosis Date  . Medical history non-contributory     Patient Active Problem List   Diagnosis Date Noted  . Spontaneous vaginal delivery 06/21/2015  . Post-dates pregnancy 06/20/2015  . Positive GBS test 06/20/2015  . Fibroids 06/20/2015  . Late prenatal care 06/20/2015  . BMI 40.0-44.9, adult ([place]) 06/20/2015    Past Surgical History:  Procedure Laterality Date  . NO PAST SURGERIES      OB History    Gravida Para Term Preterm AB Living   1 1 1     1    SAB TAB Ectopic Multiple Live Births         0 1       Home Medications    Prior to Admission medications   Medication Sig Start Date End Date Taking? Authorizing Provider  calcium carbonate (TUMS - DOSED IN MG ELEMENTAL CALCIUM) 500 MG chewable tablet Chew 2 tablets by mouth 2 (two) times daily as needed for indigestion or  heartburn.    [provider]  docusate sodium (COLACE) 100 MG capsule Take 1 capsule (100 mg total) by mouth 2 (two) times daily. 06/22/15   Ena Dawley, MD  ibuprofen (ADVIL,MOTRIN) 800 MG tablet Take 1 tablet (800 mg total) by mouth every 8 (eight) hours as needed. Patient not taking: Reported on 12/13/2016 06/22/15   Ena Dawley, MD  oxyCODONE-acetaminophen (ROXICET) 5-325 MG tablet Take 1 tablet by mouth every 4 (four) hours as needed for severe pain. 06/22/15   Ena Dawley, MD  ranitidine (ZANTAC) 150 MG tablet Take 150 mg by mouth 2 (two) times daily as needed for heartburn.    [provider]    Family History History reviewed. No pertinent family history.  Social History Social History   Tobacco Use  . Smoking status: Never Smoker  . Smokeless tobacco: Never Used  Substance Use Topics  . Alcohol use: No  . Drug use: No     Allergies   Patient has no known allergies.   Review of Systems Review of Systems  Constitutional: Negative for appetite change, chills, diaphoresis, fatigue and fever.  HENT: Positive for congestion. Negative for mouth sores, sore throat and trouble swallowing.   Eyes: Negative for visual disturbance.  Respiratory: Positive for shortness of breath. Negative for cough, chest tightness and wheezing.   Cardiovascular: Negative for  chest pain.  Gastrointestinal: Negative for abdominal distention, abdominal pain, diarrhea, nausea and vomiting.  Endocrine: Negative for polydipsia, polyphagia and polyuria.  Genitourinary: Negative for dysuria, frequency and hematuria.  Musculoskeletal: Negative for gait problem.  Skin: Negative for color change, pallor and rash.  Neurological: Negative for dizziness, syncope, light-headedness and headaches.  Hematological: Does not bruise/bleed easily.  Psychiatric/Behavioral: Negative for behavioral problems and confusion.     Physical Exam Updated Vital Signs BP 117/77   Pulse 80    Temp 99 F (37.2 C) (Oral)   Resp 10   Ht 5\' 4"  (1.626 m)   Wt 128.8 kg (284 lb)   SpO2 100%   BMI 48.75 kg/m     ED Treatments / Results  Labs (all labs ordered are listed, but only abnormal results are displayed) Labs Reviewed - No data to display  EKG  EKG Interpretation None       Radiology Dg Chest 2 View  Result Date: 12/13/2016 CLINICAL DATA:  Cough and congestion for several days EXAM: CHEST  2 VIEW COMPARISON:  None. FINDINGS: The heart size and mediastinal contours are within normal limits. Both lungs are clear. The visualized skeletal structures are unremarkable. IMPRESSION: No active cardiopulmonary disease. Electronically Signed   By: Inez Catalina M.D.   On: 12/13/2016 18:13    Procedures Procedures (including critical care time)  Medications Ordered in ED Medications - No data to display   Initial Impression / Assessment and Plan / ED Course  I have reviewed the triage vital signs and the nursing notes.  Pertinent labs & imaging results that were available during my care of the patient were reviewed by me and considered in my medical decision making (see chart for details).   negative chest x-ray. Not hypoxemic, tachypneic, tachycardic, or febrile. Doubt influenza. No signs of pneumonia. No suggestion of CHF PE or ACS. Simple restraint infection and likely side effect of medications with tachycardia from Sudafed and albuterol. Dry mouth and dry. Membranes from Flonase and Zyrtec. Plan expectant management.  Final Clinical Impressions(s) / ED Diagnoses   Final diagnoses:  Upper respiratory tract infection, unspecified type    ED Discharge Orders    None       Tanna Furry, MD 12/13/16 2125

## 2016-12-13 NOTE — Discharge Instructions (Signed)
Use Zyrtec and or Flonase as needed for nasal congestion. Both of these can cause dry nose and dry mouth. Push fluids stay hydrated. Night-time vaporizer.

## 2016-12-13 NOTE — ED Triage Notes (Signed)
Pt arrived POV c/o SOB x6 days. Pt was at urgent care 2 days ago for same

## 2019-03-20 IMAGING — DX DG CHEST 2V
2 series · 2 of 2 positions shown · non-contrast
Comparison: None.

CLINICAL DATA: Cough and congestion for several days

EXAM:
CHEST  2 VIEW

[w chest pa]
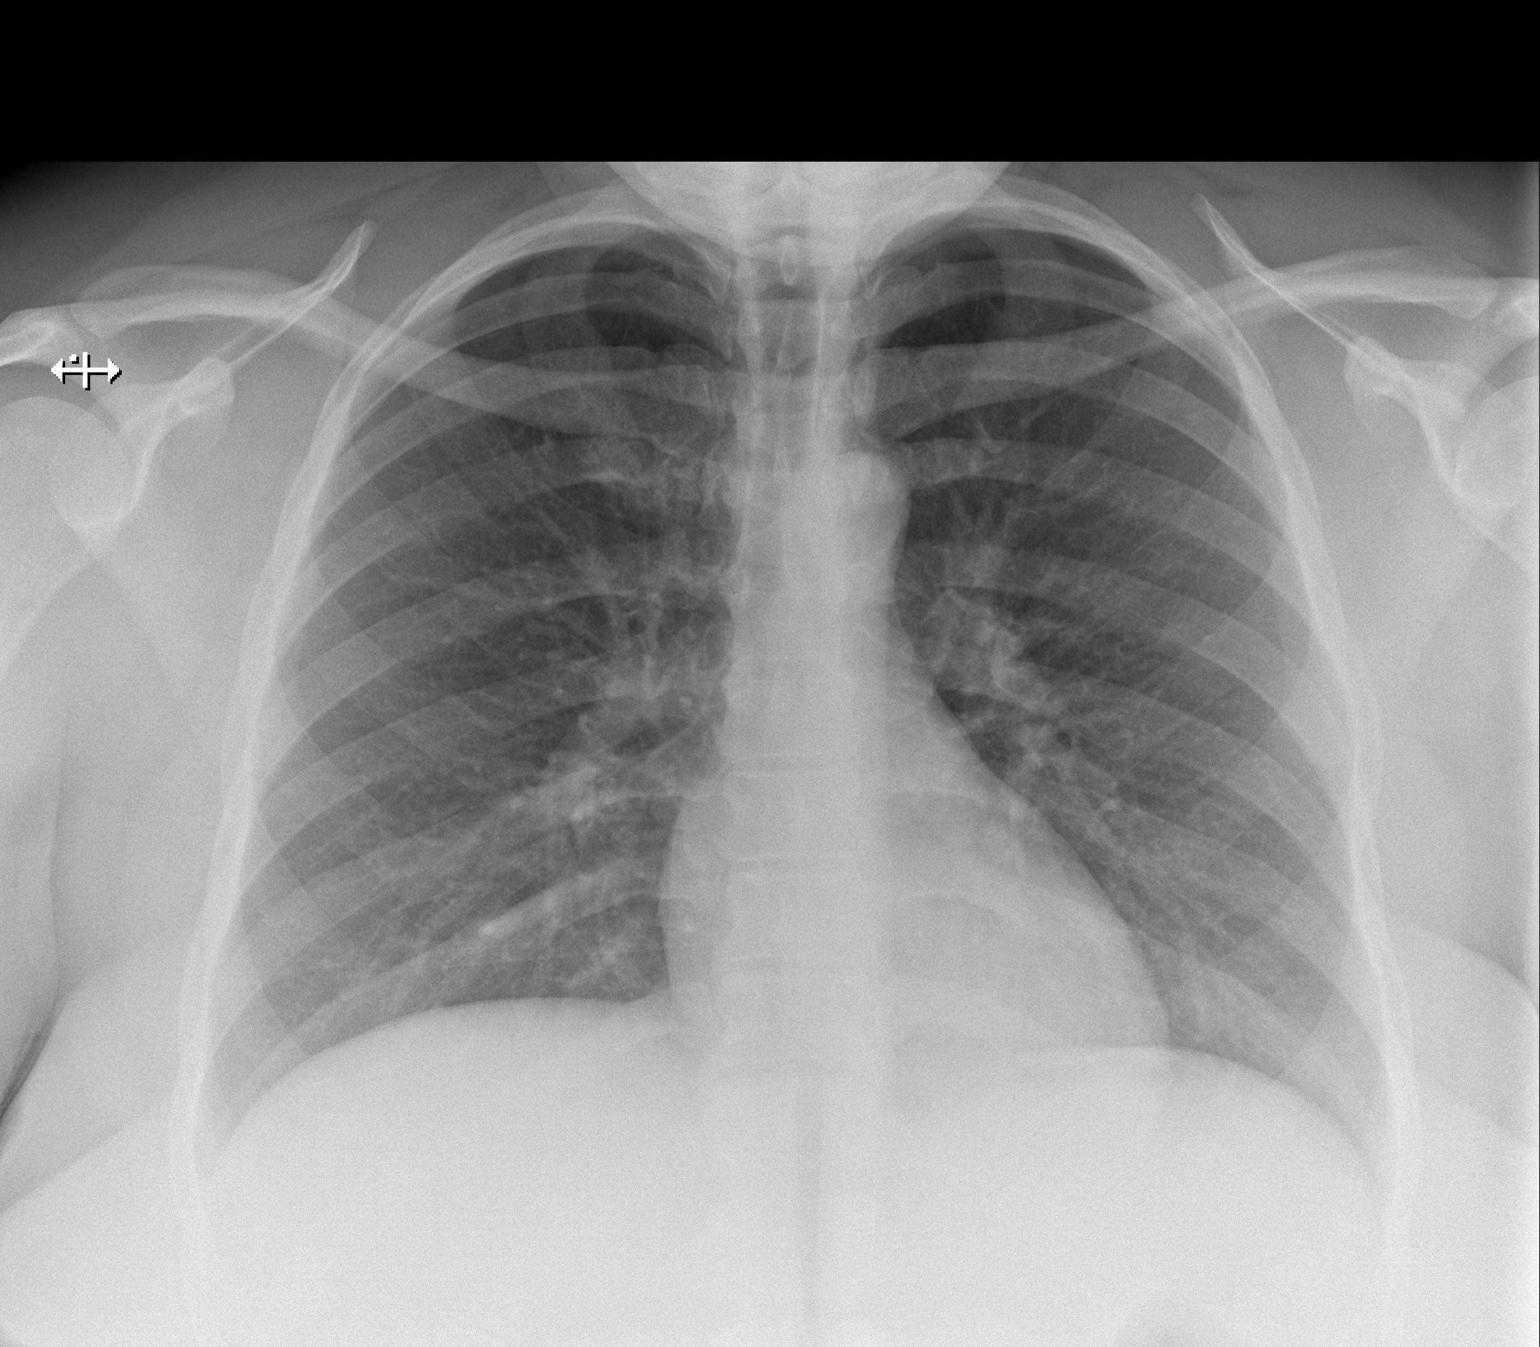

[w chest lat]
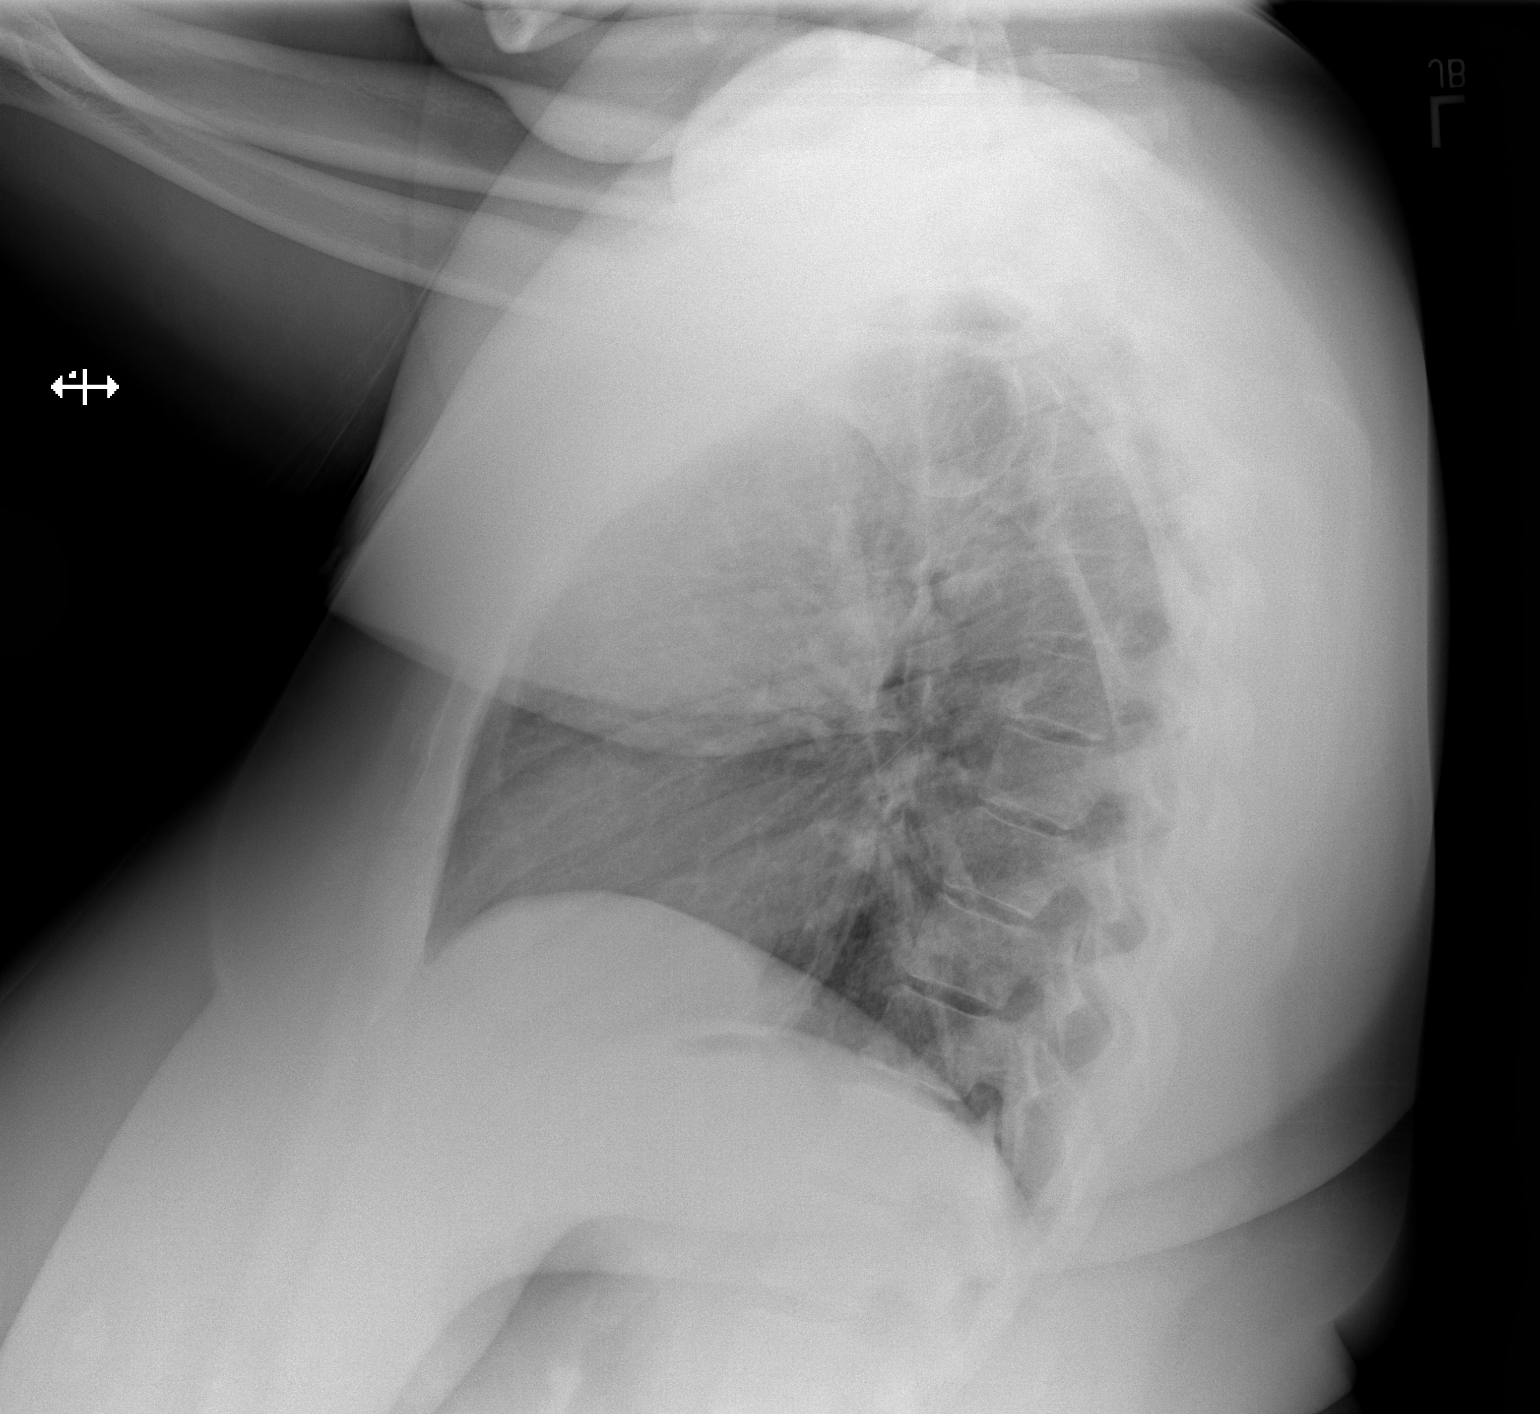

[2 of 2 positions shown; findings below may reference images not displayed]

FINDINGS: The heart size and mediastinal contours are within normal limits.
Both lungs are clear. The visualized skeletal structures are
unremarkable.
IMPRESSION: No active cardiopulmonary disease.

## 2020-01-28 NOTE — L&D Delivery Note (Signed)
Delivery Note   Patient Name: Mary Mckinney DOB: 02/19/1985 MRN: [id number]  Date of admission: 07/18/2020 Delivering MD: Noralyn Pick  Date of delivery: 07/19/20 Type of delivery: SVD  Newborn Data: Live born female  Birth Weight:   APGAR: ,   Newborn Delivery   Birth date/time: 07/19/2020 03:04:00 Delivery type: Vaginal, Spontaneous     [name], 35 y.o., @ [redacted]w[redacted]d,  [id number], who was admitted for [facility] controlled without meds, baseline PreE labs all unremarkable 6/8. Denies HA, RUQ pain or vision changes. Late entry to [facility] at 20.1 weeks. Vit D def on PO Vit D. Obesity BMI 45 at NOB. Fibroid ( anterior mid subserosal and left mid intramural.) I was called to the room when she progressed 2+ station in the second stage of labor.  She pushed for 62mins/min.  She delivered a viable infant, cephalic and restituted to the LOA position over an intact perineum.  A nuchal cord   was identified, tight and unable to reduce, newborn somersaulted through. The baby was placed on maternal abdomen while initial step of NRP were perfmored (Dry, Stimulated, and warmed). Hat placed on baby for thermoregulation. Delayed cord clamping was performed for 2 minutes.  Cord double clamped and cut.  Cord cut by father. Apgar scores were 8 and 9. Prophylactic Pitocin was started in the third stage of labor for active management. The placenta delivered spontaneously, shultz, with a 3 vessel cord and was sent to LD.  Inspection revealed none. An examination of the vaginal vault and cervix was free from lacerations. The uterus was firm, bleeding stable.   Placenta and umbilical artery blood gas were not sent.  There were no complications during the procedure.  Mom and baby skin to skin following delivery. Left in stable condition. Urine appears dark and bloody, foley bulb was found in front of the fetal head in utero, pt was tx for a UTI over a week ago, UC sent, 546mls bolus given, elevated BP during pushing x1, urine blood,  did not send PCR, will check morning cmp, denies HA, RUQ pain or vision changes.   Maternal Info: Anesthesia: Epidural Episiotomy: No Lacerations:  No Suture Repair: No Est. Blood Loss (mL):  364mls  Newborn Info:  Baby Sex: female Circumcision: N/A Babies Name: [place] APGAR (1 MIN):   APGAR (5 MINS):   APGAR (10 MINS):     Mom to postpartum.  Baby to Couplet care / Skin to Skin.   [place], [place], NP-C 07/19/20 3:23 AM

## 2020-03-08 LAB — OB RESULTS CONSOLE HEPATITIS B SURFACE ANTIGEN: Hepatitis B Surface Ag: NEGATIVE

## 2020-03-08 LAB — OB RESULTS CONSOLE GC/CHLAMYDIA
Chlamydia: NEGATIVE
Gonorrhea: NEGATIVE

## 2020-03-08 LAB — OB RESULTS CONSOLE ABO/RH: RH Type: POSITIVE

## 2020-03-08 LAB — OB RESULTS CONSOLE RPR: RPR: NONREACTIVE

## 2020-03-08 LAB — OB RESULTS CONSOLE RUBELLA ANTIBODY, IGM: Rubella: IMMUNE

## 2020-03-08 LAB — OB RESULTS CONSOLE HIV ANTIBODY (ROUTINE TESTING): HIV: NONREACTIVE

## 2020-03-08 LAB — OB RESULTS CONSOLE ANTIBODY SCREEN: Antibody Screen: NEGATIVE

## 2020-04-10 DIAGNOSIS — Z20828 Contact with and (suspected) exposure to other viral communicable diseases: Secondary | ICD-10-CM | POA: Diagnosis not present

## 2020-06-28 LAB — OB RESULTS CONSOLE GBS: GBS: NEGATIVE

## 2020-07-06 ENCOUNTER — Other Ambulatory Visit: Payer: Self-pay | Admitting: Obstetrics & Gynecology

## 2020-07-09 ENCOUNTER — Telehealth (HOSPITAL_COMMUNITY): Payer: Self-pay | Admitting: *Deleted

## 2020-07-09 ENCOUNTER — Encounter (HOSPITAL_COMMUNITY): Payer: Self-pay | Admitting: *Deleted

## 2020-07-09 NOTE — Telephone Encounter (Signed)
Preadmission screen  

## 2020-07-10 ENCOUNTER — Encounter (HOSPITAL_COMMUNITY): Payer: Self-pay

## 2020-07-11 ENCOUNTER — Telehealth (HOSPITAL_COMMUNITY): Payer: Self-pay | Admitting: *Deleted

## 2020-07-11 NOTE — Telephone Encounter (Signed)
Preadmission screen  

## 2020-07-16 ENCOUNTER — Other Ambulatory Visit (HOSPITAL_COMMUNITY): Payer: BC Managed Care – PPO | Attending: Obstetrics & Gynecology

## 2020-07-16 ENCOUNTER — Telehealth (HOSPITAL_COMMUNITY): Payer: Self-pay | Admitting: *Deleted

## 2020-07-16 NOTE — Telephone Encounter (Signed)
Preadmission screen  

## 2020-07-17 NOTE — H&P (Signed)
Mary Mckinney is a 35 y.o. female, G4P1021, IUP at 22 weeks, presenting for [facility] controlled without meds, baseline PreE labs all unremarkable 6/8. Denies HA, RUQ pain or vision changes. Late entry to [facility] at 20.1 weeks. Vit D def on PO Vit D. Obesity BMI 45 at NOB. Fibroid ( anterior mid subserosal and left mid intramural. Pt endorse + Fm. Denies vaginal leakage. Denies vaginal bleeding. Denies feeling cxt's.    Patient Active Problem List   Diagnosis Date Noted   Chronic hypertension 07/18/2020   Spontaneous vaginal delivery 06/21/2015   Post-dates pregnancy 06/20/2015   Positive GBS test 06/20/2015   Fibroids 06/20/2015   Late prenatal care 06/20/2015   BMI 40.0-44.9, adult ([place]) 06/20/2015     Active Ambulatory Problems    Diagnosis Date Noted   Post-dates pregnancy 06/20/2015   Positive GBS test 06/20/2015   Fibroids 06/20/2015   Late prenatal care 06/20/2015   BMI 40.0-44.9, adult ([place]) 06/20/2015   Spontaneous vaginal delivery 06/21/2015   Resolved Ambulatory Problems    Diagnosis Date Noted   No Resolved Ambulatory Problems   Past Medical History:  Diagnosis Date   Medical history non-contributory       Medications Prior to Admission  Medication Sig Dispense Refill Last Dose   calcium carbonate (TUMS - DOSED IN MG ELEMENTAL CALCIUM) 500 MG chewable tablet Chew 2 tablets by mouth 2 (two) times daily as needed for indigestion or heartburn.   07/18/2020   cetirizine (ZYRTEC) 10 MG tablet Take 10 mg daily by mouth.   Past Week   docusate sodium (COLACE) 100 MG capsule Take 1 capsule (100 mg total) by mouth 2 (two) times daily. 60 capsule 2 07/17/2020   fluticasone (FLONASE) 50 MCG/ACT nasal spray Place 2 sprays daily into both nostrils.   Unknown   ibuprofen (ADVIL,MOTRIN) 800 MG tablet Take 1 tablet (800 mg total) by mouth every 8 (eight) hours as needed. (Patient not taking: Reported on 12/13/2016) 50 tablet 1    oxyCODONE-acetaminophen (ROXICET) 5-325 MG tablet Take 1 tablet  by mouth every 4 (four) hours as needed for severe pain. (Patient not taking: Reported on 12/13/2016) 8 tablet 0    pseudoephedrine (SUDAFED) 120 MG 12 hr tablet Take 120 mg 2 (two) times daily by mouth.   Unknown    Past Medical History:  Diagnosis Date   Medical history non-contributory      No current facility-administered medications on file prior to encounter.   Current Outpatient Medications on File Prior to Encounter  Medication Sig Dispense Refill   calcium carbonate (TUMS - DOSED IN MG ELEMENTAL CALCIUM) 500 MG chewable tablet Chew 2 tablets by mouth 2 (two) times daily as needed for indigestion or heartburn.     cetirizine (ZYRTEC) 10 MG tablet Take 10 mg daily by mouth.     docusate sodium (COLACE) 100 MG capsule Take 1 capsule (100 mg total) by mouth 2 (two) times daily. 60 capsule 2   fluticasone (FLONASE) 50 MCG/ACT nasal spray Place 2 sprays daily into both nostrils.     ibuprofen (ADVIL,MOTRIN) 800 MG tablet Take 1 tablet (800 mg total) by mouth every 8 (eight) hours as needed. (Patient not taking: Reported on 12/13/2016) 50 tablet 1   oxyCODONE-acetaminophen (ROXICET) 5-325 MG tablet Take 1 tablet by mouth every 4 (four) hours as needed for severe pain. (Patient not taking: Reported on 12/13/2016) 8 tablet 0   pseudoephedrine (SUDAFED) 120 MG 12 hr tablet Take 120 mg 2 (two) times daily  by mouth.       No Known Allergies  History of present pregnancy: Pt Info/Preference:  Screening/Consents:  Labs:   EDD: Estimated Date of Delivery: None noted.  Establised: No LMP recorded. Patient is pregnant.  Anatomy Scan: Date: 03/08/2020 Placenta Location: posterior Genetic Screen: [place]:eclined AFP:  First Tri: Quad:  Office: [facility]            Md: DR Alwyn Pea First PNV: 20.1 weeks Blood Type --/--/O POS (06/22 0230)  Language: english Last PNV: 38.5 weeks Rhogam    Flu Vaccine:  declined   Antibody NEG (06/22 0230)  TDaP vaccine UTD   GTT: Early: 5.6 Third Trimester: 133   Feeding Plan: Breat/bottle BTL: no Rubella: Immune (02/10 0000)  Contraception: ??? VBAC: no RPR: Nonreactive (02/10 0000)   Circumcision: ???   HBsAg: Negative (02/10 0000)  Pediatrician:  ???   HIV: Non-reactive (02/10 0000)   Prenatal Classes: no Additional [place]: Reactive NST 6/8 GBS: Negative/-- (06/02 0000)Negative. (For PCN allergy, check sensitivities)       Chlamydia: neg    MFM Referral/Consult:  GC: neg  Support Person: partner   PAP: ???  Pain Management: epidural Neonatologist Referral:  Hgb Electrophoresis:  AA  Birth Plan: [place], vaginal   Hgb NOB: 12.3    28W: 12   OB History     Gravida  4   Para  1   Term  1   Preterm      AB  2   Living  1      SAB  2   IAB      Ectopic      Multiple  0   Live Births  1          Past Medical History:  Diagnosis Date   Medical history non-contributory    Past Surgical History:  Procedure Laterality Date   NO PAST SURGERIES     Family History: family history is not on file. Social History:  reports that she has never smoked. She has never used smokeless tobacco. She reports that she does not drink alcohol and does not use drugs.   Prenatal Transfer Tool  Maternal Diabetes: No Genetic Screening: Declined Maternal Ultrasounds/Referrals: Normal Fetal Ultrasounds or other Referrals:  None Maternal Substance Abuse:  No Significant Maternal Medications:  None Significant Maternal Lab Results: Group B Strep negative  ROS:  Review of Systems  Constitutional: Negative.   HENT: Negative.    Eyes: Negative.   Respiratory: Negative.    Cardiovascular: Negative.   Gastrointestinal: Negative.   Genitourinary: Negative.   Musculoskeletal: Negative.   Skin: Negative.   Neurological: Negative.   Endo/Heme/Allergies: Negative.   Psychiatric/Behavioral: Negative.      Physical Exam: BP 134/84   Pulse 77   Temp 98.4 F (36.9 C) (Oral)   Resp 18   Ht 5\' 4"  (1.626 m)   Wt 130.2 kg   BMI 49.28 kg/m    Physical Exam Vitals and nursing note reviewed.  Constitutional:      Appearance: Normal appearance.  HENT:     Head: Normocephalic and atraumatic.     Nose: Nose normal.     Mouth/Throat:     Mouth: Mucous membranes are moist.  Eyes:     Conjunctiva/sclera: Conjunctivae normal.     Pupils: Pupils are equal, round, and reactive to light.  Cardiovascular:     Rate and Rhythm: Normal rate and regular rhythm.     Pulses: Normal pulses.  Heart sounds: Normal heart sounds.  Pulmonary:     Effort: Pulmonary effort is normal.     Breath sounds: Normal breath sounds.  Abdominal:     General: Bowel sounds are normal.     Palpations: Abdomen is soft.  Genitourinary:    Comments: Uterus gravida equal to dates, pelvis adequate for vaginal delivery.  Musculoskeletal:        General: Normal range of motion.     Cervical back: Normal range of motion and neck supple.  Skin:    General: Skin is warm.     Capillary Refill: Capillary refill takes less than 2 seconds.  Neurological:     General: No focal deficit present.     Mental Status: She is alert.  Psychiatric:        Mood and Affect: Mood normal.     NST: FHR baseline 12bpm, Variability: moderate, Accelerations:present, Decelerations:  Absent= Cat 1/Reactive UC:   OCC SVE:   Dilation: 1.5 Effacement (%): 40 Station: -3 Exam by:: Petra Kuba, RN, vertex verified by fetal sutures.  Leopold's: Position vertex, EFW 8lbs via leopold's.  Pelvis proven to 7.8lbs.   Labs: Results for orders placed or performed during the hospital encounter of 07/18/20 (from the past 24 hour(s))  Resp Panel by RT-PCR (Flu A&B, Covid) Nasopharyngeal Swab     Status: None   Collection Time: 07/18/20  2:00 AM   Specimen: Nasopharyngeal Swab; Nasopharyngeal(NP) swabs in vial transport medium  Result Value Ref Range   SARS Coronavirus 2 by RT PCR NEGATIVE NEGATIVE   Influenza A by PCR NEGATIVE NEGATIVE   Influenza B by PCR NEGATIVE NEGATIVE  CBC      Status: None   Collection Time: 07/18/20  2:30 AM  Result Value Ref Range   WBC 8.4 4.0 - 10.5 K/uL   RBC 4.34 3.87 - 5.11 MIL/uL   Hemoglobin 12.8 12.0 - 15.0 g/dL   HCT 38.3 36.0 - 46.0 %   MCV 88.2 80.0 - 100.0 fL   MCH 29.5 26.0 - 34.0 pg   MCHC 33.4 30.0 - 36.0 g/dL   RDW 14.3 11.5 - 15.5 %   Platelets 188 150 - 400 K/uL   nRBC 0.0 0.0 - 0.2 %  Type and screen     Status: None   Collection Time: 07/18/20  2:30 AM  Result Value Ref Range   ABO/RH(D) O POS    Antibody Screen NEG    Sample Expiration      07/21/2020,2359 Performed at [facility], [place] [address] [place], [place] [address]     Imaging:  No results found.  MAU Course: Orders Placed This Encounter  Procedures   Resp Panel by RT-PCR (Flu A&B, Covid) Nasopharyngeal Swab   OB RESULT CONSOLE Group B Strep   CBC   RPR   Diet laboring Room service appropriate? Yes   Vitals signs per unit policy   Notify Physician   Fetal monitoring per unit policy   Activity as tolerated   Measure blood pressure post delivery every 15 min x 1 hour then every 30 min x 1 hour   Fundal check post delivery every 15 min x 1 hour then every 30 min x 1 hour   If Rapid HIV test positive or known HIV positive: initiate AZT orders   May in and out cath x 2 for inability to void   Discontinue foley prior to vaginal delivery   Initiate Carrier Fluid Protocol   Initiate Oral  Care Protocol   Order Rapid HIV per protocol if no results on chart   Patient may have epidural placement upon request   Evaluate fetal heart rate to establish reassuring pattern prior to initiating Cytotec or Pitocin   Perform a cervical exam prior to initiating Cytotec or Pitocin   Discontinue Pitocin if tachysystole with non-reassuring FHR is present   Nofify MD/CNM if tachysystole with non-reassuring FHR is present   Initiate intrauterine resuscitation if tachysystole with non-reasuring FHR is present   If tachysystole WITH reassuring FHR present  notify MD / CNM   May administer Terbutaline 0.25 mg SQ x 1 dose if tachysystole with non-reassuring FHR is presesnt   Labor Induction   Cervical Exam   Full code   Airborne and Contact precautions   Type and screen   Insert and maintain IV Line   Admit to Inpatient (patient's expected length of stay will be greater than 2 midnights or inpatient only procedure)   Meds ordered this encounter  Medications   lactated ringers infusion   oxytocin (PITOCIN) IV BOLUS FROM BAG   oxytocin (PITOCIN) IV infusion 30 units in NS 500 mL - Premix   lactated ringers infusion 500-1,000 mL   acetaminophen (TYLENOL) tablet 650 mg   ondansetron (ZOFRAN) injection 4 mg   sodium citrate-citric acid (ORACIT) solution 30 mL   lidocaine (PF) (XYLOCAINE) 1 % injection 30 mL   terbutaline (BRETHINE) injection 0.25 mg   misoprostol (CYTOTEC) tablet 25 mcg    Assessment/Plan: DANETTE WEINFELD is a 35 y.o. female, G4P1021, IUP at 35 weeks, presenting for [facility] controlled without meds, baseline PreE labs all unremarkable 6/8. Denies HA, RUQ pain or vision changes. Late entry to [facility] at 20.1 weeks. Vit D def on PO Vit D. Obesity BMI 45 at NOB. Fibroid ( anterior mid subserosal and left mid intramural. Pt endorse + Fm. Denies vaginal leakage. Denies vaginal bleeding. Denies feeling cxt's.   FWB: Cat 1 Fetal Tracing.   Plan: Admit to [place] per consult with DR Alwyn Pea Routine [facility] orders Pain med/epidural prn Anticipate cyotec for cervical ripening Once ripe, anticipate pitocin, AROM. CHTN: Monitor BP, if elevated will draw labs and start PO antihypertensive.  Anticipate labor progression   Noralyn Pick NP-C, CNM, MSN 07/18/2020, 6:03 AM

## 2020-07-18 ENCOUNTER — Inpatient Hospital Stay (HOSPITAL_COMMUNITY)
Admission: AD | Admit: 2020-07-18 | Discharge: 2020-07-21 | DRG: 807 | Disposition: A | Discharge status: Home / Self Care | Payer: BC Managed Care – PPO | Attending: Obstetrics and Gynecology | Admitting: Obstetrics and Gynecology

## 2020-07-18 ENCOUNTER — Encounter (HOSPITAL_COMMUNITY): Payer: Self-pay | Admitting: Obstetrics & Gynecology

## 2020-07-18 ENCOUNTER — Other Ambulatory Visit: Payer: Self-pay

## 2020-07-18 ENCOUNTER — Inpatient Hospital Stay (HOSPITAL_COMMUNITY): Payer: BC Managed Care – PPO

## 2020-07-18 ENCOUNTER — Inpatient Hospital Stay (HOSPITAL_COMMUNITY): Payer: BC Managed Care – PPO | Admitting: Anesthesiology

## 2020-07-18 DIAGNOSIS — Z3A39 39 weeks gestation of pregnancy: Secondary | ICD-10-CM | POA: Diagnosis not present

## 2020-07-18 DIAGNOSIS — D252 Subserosal leiomyoma of uterus: Secondary | ICD-10-CM | POA: Diagnosis not present

## 2020-07-18 DIAGNOSIS — O1002 Pre-existing essential hypertension complicating childbirth: Principal | ICD-10-CM | POA: Diagnosis present

## 2020-07-18 DIAGNOSIS — O99214 Obesity complicating childbirth: Secondary | ICD-10-CM | POA: Diagnosis present

## 2020-07-18 DIAGNOSIS — D251 Intramural leiomyoma of uterus: Secondary | ICD-10-CM | POA: Diagnosis present

## 2020-07-18 DIAGNOSIS — O3413 Maternal care for benign tumor of corpus uteri, third trimester: Secondary | ICD-10-CM | POA: Diagnosis not present

## 2020-07-18 DIAGNOSIS — Z20822 Contact with and (suspected) exposure to covid-19: Secondary | ICD-10-CM | POA: Diagnosis not present

## 2020-07-18 DIAGNOSIS — I10 Essential (primary) hypertension: Secondary | ICD-10-CM | POA: Diagnosis present

## 2020-07-18 DIAGNOSIS — O164 Unspecified maternal hypertension, complicating childbirth: Secondary | ICD-10-CM | POA: Diagnosis not present

## 2020-07-18 LAB — RESP PANEL BY RT-PCR (FLU A&B, COVID) ARPGX2
Influenza A by PCR: NEGATIVE
Influenza B by PCR: NEGATIVE
SARS Coronavirus 2 by RT PCR: NEGATIVE

## 2020-07-18 LAB — CBC
HCT: 38.3 % (ref 36.0–46.0)
HCT: 40.1 % (ref 36.0–46.0)
Hemoglobin: 12.8 g/dL (ref 12.0–15.0)
Hemoglobin: 13.4 g/dL (ref 12.0–15.0)
MCH: 29.5 pg (ref 26.0–34.0)
MCH: 29.1 pg (ref 26.0–34.0)
MCHC: 33.4 g/dL (ref 30.0–36.0)
MCHC: 33.4 g/dL (ref 30.0–36.0)
MCV: 88.2 fL (ref 80.0–100.0)
MCV: 87 fL (ref 80.0–100.0)
Platelets: 188 10*3/uL (ref 150–400)
Platelets: 190 10*3/uL (ref 150–400)
RBC: 4.34 MIL/uL (ref 3.87–5.11)
RBC: 4.61 MIL/uL (ref 3.87–5.11)
RDW: 14.3 % (ref 11.5–15.5)
RDW: 14.3 % (ref 11.5–15.5)
WBC: 8.4 10*3/uL (ref 4.0–10.5)
WBC: 9.2 10*3/uL (ref 4.0–10.5)
nRBC: 0 % (ref 0.0–0.2)
nRBC: 0 % (ref 0.0–0.2)

## 2020-07-18 LAB — TYPE AND SCREEN
ABO/RH(D): O POS
Antibody Screen: NEGATIVE

## 2020-07-18 LAB — RPR: RPR Ser Ql: NONREACTIVE

## 2020-07-18 MED ORDER — PHENYLEPHRINE 40 MCG/ML (10ML) SYRINGE FOR IV PUSH (FOR BLOOD PRESSURE SUPPORT): 80.0000 ug | PREFILLED_SYRINGE | INTRAVENOUS | Status: DC | PRN

## 2020-07-18 MED ORDER — EPHEDRINE 5 MG/ML INJ: 10.0000 mg | INTRAVENOUS | Status: DC | PRN

## 2020-07-18 MED ORDER — SOD CITRATE-CITRIC ACID 500-334 MG/5ML PO SOLN
30.0000 mL | ORAL | Status: DC | PRN
  Administered 2020-07-18: 30 mL via ORAL
  Filled 2020-07-18: qty 30

## 2020-07-18 MED ORDER — LIDOCAINE HCL (PF) 1 % IJ SOLN
30.0000 mL | INTRAMUSCULAR | Status: DC | PRN
Start: 2020-07-18 — End: 2020-07-19

## 2020-07-18 MED ORDER — FENTANYL CITRATE (PF) 100 MCG/2ML IJ SOLN
50.0000 ug | INTRAMUSCULAR | Status: DC | PRN
  Administered 2020-07-18: 100 ug via INTRAVENOUS
  Filled 2020-07-18: qty 2

## 2020-07-18 MED ORDER — MISOPROSTOL 25 MCG QUARTER TABLET
25.0000 ug | ORAL_TABLET | ORAL | Status: DC | PRN
  Administered 2020-07-18 (×2): 25 ug via VAGINAL
  Filled 2020-07-18 (×2): qty 1

## 2020-07-18 MED ORDER — OXYTOCIN BOLUS FROM INFUSION
333.0000 mL | Freq: Once | INTRAVENOUS | Status: AC
Start: 2020-07-18 — End: 2020-07-19
  Administered 2020-07-19: 333 mL via INTRAVENOUS

## 2020-07-18 MED ORDER — OXYTOCIN-SODIUM CHLORIDE 30-0.9 UT/500ML-% IV SOLN
2.5000 [IU]/h | INTRAVENOUS | Status: DC
  Filled 2020-07-18: qty 500

## 2020-07-18 MED ORDER — LIDOCAINE HCL (PF) 1 % IJ SOLN
INTRAMUSCULAR | Status: DC | PRN
  Administered 2020-07-18: 8 mL via EPIDURAL

## 2020-07-18 MED ORDER — ACETAMINOPHEN 325 MG PO TABS: 650.0000 mg | ORAL_TABLET | ORAL | Status: DC | PRN

## 2020-07-18 MED ORDER — ONDANSETRON HCL 4 MG/2ML IJ SOLN
4.0000 mg | Freq: Four times a day (QID) | INTRAMUSCULAR | Status: DC | PRN
  Administered 2020-07-18: 4 mg via INTRAVENOUS
  Filled 2020-07-18: qty 2

## 2020-07-18 MED ORDER — TERBUTALINE SULFATE 1 MG/ML IJ SOLN: 0.2500 mg | Freq: Once | INTRAMUSCULAR | Status: DC | PRN

## 2020-07-18 MED ORDER — DIPHENHYDRAMINE HCL 50 MG/ML IJ SOLN: 12.5000 mg | INTRAMUSCULAR | Status: DC | PRN

## 2020-07-18 MED ORDER — FENTANYL-BUPIVACAINE-NACL 0.5-0.125-0.9 MG/250ML-% EP SOLN
12.0000 mL/h | EPIDURAL | Status: DC | PRN
  Administered 2020-07-18: 12 mL/h via EPIDURAL
  Filled 2020-07-18: qty 250

## 2020-07-18 MED ORDER — LACTATED RINGERS IV SOLN: 500.0000 mL | INTRAVENOUS | Status: DC | PRN

## 2020-07-18 MED ORDER — OXYTOCIN-SODIUM CHLORIDE 30-0.9 UT/500ML-% IV SOLN
1.0000 m[IU]/min | INTRAVENOUS | Status: DC
  Administered 2020-07-18: 2 m[IU]/min via INTRAVENOUS

## 2020-07-18 MED ORDER — FENTANYL-BUPIVACAINE-NACL 0.5-0.125-0.9 MG/250ML-% EP SOLN: 12.0000 mL/h | EPIDURAL | Status: DC | PRN

## 2020-07-18 MED ORDER — LACTATED RINGERS IV SOLN: INTRAVENOUS | Status: DC

## 2020-07-18 MED ORDER — LACTATED RINGERS IV SOLN
500.0000 mL | Freq: Once | INTRAVENOUS | Status: AC
  Administered 2020-07-18 – 2020-07-19 (×2): 500 mL via INTRAVENOUS

## 2020-07-18 NOTE — Anesthesia Procedure Notes (Signed)
Epidural Patient location during procedure: OB Start time: 07/18/2020 7:46 PM End time: 07/18/2020 7:50 PM  Staffing Anesthesiologist: Janeece Riggers, MD  Preanesthetic Checklist Completed: patient identified, IV checked, site marked, risks and benefits discussed, surgical consent, monitors and equipment checked, pre-op evaluation and timeout performed  Epidural Patient position: sitting Prep: DuraPrep and site prepped and draped Patient monitoring: continuous pulse ox and blood pressure Approach: midline Location: L2-L3 Injection technique: LOR air  Needle:  Needle type: Tuohy  Needle gauge: 17 G Needle length: 9 cm and 9 Needle insertion depth: 7 cm Catheter type: closed end flexible Catheter size: 19 Gauge Catheter at skin depth: 14 cm Test dose: negative  Assessment Events: blood not aspirated, injection not painful, no injection resistance, no paresthesia and negative IV test  Additional Notes SRNA

## 2020-07-18 NOTE — Progress Notes (Signed)
Labor Progress Note  Mary Mckinney is a 35 y.o. female, G4P1021, IUP at 28 weeks, presenting for [facility] controlled without meds, baseline PreE labs all unremarkable 6/8. Denies HA, RUQ pain or vision changes. Late entry to [facility] at 20.1 weeks. Vit D def on PO Vit D. Obesity BMI 45 at NOB. Fibroid ( anterior mid subserosal and left mid intramural.  Subjective: Pt feeling cxt and requesting epidural, denies HA, RUQ pain or vision changes. Family support at bedside.  Patient Active Problem List   Diagnosis Date Noted   Chronic hypertension 07/18/2020   Spontaneous vaginal delivery 06/21/2015   Post-dates pregnancy 06/20/2015   Positive GBS test 06/20/2015   Fibroids 06/20/2015   Late prenatal care 06/20/2015   BMI 40.0-44.9, adult ([place]) 06/20/2015   Objective: BP (!) 141/71   Pulse 69   Temp 97.6 F (36.4 C)   Resp 18   Ht 5\' 4"  (1.626 m)   Wt 130.2 kg   BMI 49.28 kg/m  I/O last 3 completed shifts: In: 240 [P.O.:240] Out: -  No intake/output data recorded. NST: FHR baseline 130 bpm, Variability: moderate, Accelerations:present, Decelerations:  Absent= Cat 1/Reactive CTX:  regular, every 2 minutes, lasting 60 seconds Uterus gravid, soft non tender, moderate to palpate with contractions.  SVE:  Dilation: 4 Effacement (%): 80 Station: -1 Exam by:: [employer] Pitocin at (16) mUn/min  Assessment:  Mary Mckinney is a 35 y.o. female, G4P1021, IUP at 21 weeks, presenting for [facility] controlled without meds, baseline PreE labs all unremarkable 6/8. Denies HA, RUQ pain or vision changes. Late entry to [facility] at 20.1 weeks. Vit D def on PO Vit D. Obesity BMI 45 at NOB. Fibroid ( anterior mid subserosal and left mid intramural. Progressing in latent labor off 2 Cytotec, pitocin and now AROM. Requesting epidural now.   Patient Active Problem List   Diagnosis Date Noted   Chronic hypertension 07/18/2020   Spontaneous vaginal delivery 06/21/2015   Post-dates pregnancy 06/20/2015   Positive GBS test  06/20/2015   Fibroids 06/20/2015   Late prenatal care 06/20/2015   BMI 40.0-44.9, adult ([place]) 06/20/2015   NICHD: Category 1  Membranes: AROM @1402  on 6/22, clear , no s/s of infection  Induction:    Cytotec x@ 0712, 0556, d/c'ed   Foley Bulb: 4cm dilated, N/A  Pitocin - 16  Pain management:               IV pain management: x fentanyl @ 1500  Nitrous: PRN             Epidural placement: PRN  GBS Negative  CHTN: stable, asymptomatic, unremarkable PreE labs on 6/8, BP now 141/71, asymptomatic.    Plan: Continue labor plan CHTN: monitor BP if elevated persistently >140/90s will draw labs and start PO antihypertensives.  Continuous monitoring Rest Ambulate Requesting epidural CBC redrawn d/t CHTN.  Frequent position changes to facilitate fetal rotation and descent. Will reassess with cervical exam at 4 hours or earlier if necessary Continue pitocin 2x2 per protocol.  Anticipate labor progression and vaginal delivery.   Noralyn Pick, NP-C, CNM, MSN 07/18/2020. 8:00 PM

## 2020-07-18 NOTE — Progress Notes (Signed)
Labor Progress Note  Mary Mckinney is a 35 y.o. female, G4P1021, IUP at 69 weeks, presenting for [facility] controlled without meds, baseline PreE labs all unremarkable 6/8. Denies HA, RUQ pain or vision changes. Late entry to [facility] at 20.1 weeks. Vit D def on PO Vit D. Obesity BMI 45 at NOB. Fibroid ( anterior mid subserosal and left mid intramural.  Subjective: Pt feeling cxt and stable, pt awake and on yoga ball, consented to AROM, progressing off two Cytotec, pitocin and now AROM, pt progressed to 4cm Patient Active Problem List   Diagnosis Date Noted   Chronic hypertension 07/18/2020   Spontaneous vaginal delivery 06/21/2015   Post-dates pregnancy 06/20/2015   Positive GBS test 06/20/2015   Fibroids 06/20/2015   Late prenatal care 06/20/2015   BMI 40.0-44.9, adult ([place]) 06/20/2015   Objective: BP 123/80   Pulse 77   Temp 98.9 F (37.2 C)   Resp 17   Ht 5\' 4"  (1.626 m)   Wt 130.2 kg   BMI 49.28 kg/m  No intake/output data recorded. Total I/O In: 240 [P.O.:240] Out: -  NST: FHR baseline 125 bpm, Variability: moderate, Accelerations:present, Decelerations:  Absent= Cat 1/Reactive CTX:  regular, every 3-6 minutes Uterus gravid, soft non tender, moderate to palpate with contractions.  SVE:  Dilation: 4 Effacement (%): 80 Station: -1 Exam by:: [facility] Pitocin at (10) mUn/min AROM ,clear, moderate amount, tolerated well.   Assessment:  Mary Mckinney is a 35 y.o. female, G4P1021, IUP at 39 weeks, presenting for [facility] controlled without meds, baseline PreE labs all unremarkable 6/8. Denies HA, RUQ pain or vision changes. Late entry to [facility] at 20.1 weeks. Vit D def on PO Vit D. Obesity BMI 45 at NOB. Fibroid ( anterior mid subserosal and left mid intramural. Progressing in latent labor off 2 Cytotec, pitocin and now AROM  Patient Active Problem List   Diagnosis Date Noted   Chronic hypertension 07/18/2020   Spontaneous vaginal delivery 06/21/2015   Post-dates pregnancy 06/20/2015    Positive GBS test 06/20/2015   Fibroids 06/20/2015   Late prenatal care 06/20/2015   BMI 40.0-44.9, adult ([place]) 06/20/2015   NICHD: Category 1  Membranes: AROM @1402  on 6/22, clear , no s/s of infection  Induction:    Cytotec x@ 0135, 0556, d/c'ed   Foley Bulb: 4cm dilated, N/A  Pitocin - 10  Pain management:               IV pain management: x PRN  Nitrous: PRN             Epidural placement: PRN  GBS Negative  CHTN: stable, asymptomatic, unremarkable PreE labs on 6/8, BP now 123/80, asymptomatic.    Plan: Continue labor plan CHTN: monitor BP if elevated persistently >140/90s will draw labs and start PO antihypertensives.  Continuous monitoring Rest Ambulate Frequent position changes to facilitate fetal rotation and descent. Will reassess with cervical exam at 4 hours or earlier if necessary Continue pitocin 2x2 per protocol.  Anticipate labor progression and vaginal delivery.  Estimated birth time 65.  Noralyn Pick, NP-C, CNM, MSN 07/18/2020. 2:47 PM

## 2020-07-18 NOTE — Progress Notes (Signed)
Labor Progress Note  Mary Mckinney is a 35 y.o. female, G4P1021, IUP at 44 weeks, presenting for [facility] controlled without meds, baseline PreE labs all unremarkable 6/8. Denies HA, RUQ pain or vision changes. Late entry to [facility] at 20.1 weeks. Vit D def on PO Vit D. Obesity BMI 45 at NOB. Fibroid ( anterior mid subserosal and left mid intramural.  Subjective: Pt feeling cxt and stable, consented to start pitocin, progressing off two Cytotec and now will being pitocin, pt progressed to 3cm Patient Active Problem List   Diagnosis Date Noted   Chronic hypertension 07/18/2020   Spontaneous vaginal delivery 06/21/2015   Post-dates pregnancy 06/20/2015   Positive GBS test 06/20/2015   Fibroids 06/20/2015   Late prenatal care 06/20/2015   BMI 40.0-44.9, adult ([place]) 06/20/2015   Objective: BP 104/64   Pulse 88   Temp 98.4 F (36.9 C) (Oral)   Resp 17   Ht 5\' 4"  (1.626 m)   Wt 130.2 kg   BMI 49.28 kg/m  No intake/output data recorded. Total I/O In: 240 [P.O.:240] Out: -  NST: FHR baseline 125 bpm, Variability: moderate, Accelerations:present, Decelerations:  Absent= Cat 1/Reactive CTX:  regular, every 3-4 minutes Uterus gravid, soft non tender, moderate to palpate with contractions.  SVE:  Dilation: 3 Effacement (%): 80 Station: -2 Exam by:: [employer] Pitocin at (2) mUn/min  Assessment:  Mary Mckinney is a 35 y.o. female, G4P1021, IUP at 57 weeks, presenting for [facility] controlled without meds, baseline PreE labs all unremarkable 6/8. Denies HA, RUQ pain or vision changes. Late entry to [facility] at 20.1 weeks. Vit D def on PO Vit D. Obesity BMI 45 at NOB. Fibroid ( anterior mid subserosal and left mid intramural. Progressing in early labor off 2 Cytotec.  Patient Active Problem List   Diagnosis Date Noted   Chronic hypertension 07/18/2020   Spontaneous vaginal delivery 06/21/2015   Post-dates pregnancy 06/20/2015   Positive GBS test 06/20/2015   Fibroids 06/20/2015   Late prenatal care  06/20/2015   BMI 40.0-44.9, adult ([place]) 06/20/2015   NICHD: Category 1  Membranes: Intact, no s/s of infection  Induction:    Cytotec x@ 1572, 0556, d/c'ed   Foley Bulb: No, pt 2.5cm dilated and stretchy  Pitocin - started at 2  Pain management:               IV pain management: x PRN  Nitrous: PRN             Epidural placement: PRN  GBS Negative  CHTN: stable, asymptomatic, unremarkable PreE labs on 6/8, BP now 104/64, asymptomatic.    Plan: Continue labor plan CHTN: monitor BP if elevated persistently >140/90s will draw labs and start PO antihypertensives.  Continuous monitoring Rest Ambulate Frequent position changes to facilitate fetal rotation and descent. Will reassess with cervical exam at 4 hours or earlier if necessary STart pitocin 2x2 now per protocol.  Anticipate AROM once fetal head in pelvis.  Anticipate labor progression and vaginal delivery.  Estimated birth time 25.  Noralyn Pick, NP-C, CNM, MSN 07/18/2020. 10:24 AM

## 2020-07-18 NOTE — Progress Notes (Signed)
Labor Progress Note  Mary Mckinney is a 35 y.o. female, G4P1021, IUP at 41 weeks, presenting for [facility] controlled without meds, baseline PreE labs all unremarkable 6/8. Denies HA, RUQ pain or vision changes. Late entry to [facility] at 20.1 weeks. Vit D def on PO Vit D. Obesity BMI 45 at NOB. Fibroid ( anterior mid subserosal and left mid intramural.  Subjective: Pt in bed in NAD, resting well, feeling cxt but mild and tolerates well, pt endorses there will be a long period where she does not feel them then she will feel a few. Discussed POC R/B/A of pitocin, cytotec, and AROM, pt consented to continue with cytotec.  Patient Active Problem List   Diagnosis Date Noted   Chronic hypertension 07/18/2020   Spontaneous vaginal delivery 06/21/2015   Post-dates pregnancy 06/20/2015   Positive GBS test 06/20/2015   Fibroids 06/20/2015   Late prenatal care 06/20/2015   BMI 40.0-44.9, adult ([place]) 06/20/2015   Objective: BP 134/84   Pulse 77   Temp 98.4 F (36.9 C) (Oral)   Resp 18   Ht 5\' 4"  (1.626 m)   Wt 130.2 kg   BMI 49.28 kg/m  No intake/output data recorded. No intake/output data recorded. NST: FHR baseline 125 bpm, Variability: moderate, Accelerations:present, Decelerations:  Absent= Cat 1/Reactive CTX:  regular, every 3-4 minutes Uterus gravid, soft non tender, moderate to palpate with contractions.  SVE:  Dilation: 2.5 Effacement (%): 50 Station: -2 Exam by:: Petra Kuba, RN Pitocin at (On cytotec) mUn/min  Assessment:  Mary Mckinney is a 35 y.o. female, G4P1021, IUP at 13 weeks, presenting for [facility] controlled without meds, baseline PreE labs all unremarkable 6/8. Denies HA, RUQ pain or vision changes. Late entry to [facility] at 20.1 weeks. Vit D def on PO Vit D. Obesity BMI 45 at NOB. Fibroid ( anterior mid subserosal and left mid intramural. Progressing in early labor off 2 Cytotec.  Patient Active Problem List   Diagnosis Date Noted   Chronic hypertension 07/18/2020   Spontaneous vaginal  delivery 06/21/2015   Post-dates pregnancy 06/20/2015   Positive GBS test 06/20/2015   Fibroids 06/20/2015   Late prenatal care 06/20/2015   BMI 40.0-44.9, adult ([place]) 06/20/2015   NICHD: Category 1  Membranes: Intact, no s/s of infection  Induction:    Cytotec x@ 6578, 0556  Foley Bulb: No, pt 2.5cm dilated and stretchy  Pitocin - N/A  Pain management:               IV pain management: x PRN  Nitrous: PRN             Epidural placement: PRN  GBS Negative  CHTN: table, asymptomatic, unremarkable PreE labs on 6/8, BP now 134/84, asymptomatic.    Plan: Continue labor plan CHTN: monitor BP if elevated persistently >140/90s will draw labs and start PO antihypertensives.  Continuous monitoring Rest Ambulate Frequent position changes to facilitate fetal rotation and descent. Will reassess with cervical exam at 4 hours or earlier if necessary Continue Cytotec per protocol Anticipate pitocin once cervix ripe Anticipate AROM once fetal head in pelvis.  Anticipate labor progression and vaginal delivery.   Md Mancel Bale to be made aware of plan @ [place], NP-C, CNM, MSN 07/18/2020. 6:04 AM

## 2020-07-18 NOTE — Anesthesia Preprocedure Evaluation (Signed)
Anesthesia Evaluation  Patient identified by MRN, date of birth, ID band Patient awake    Reviewed: Allergy & Precautions, H&P , NPO status , Patient's Chart, lab work & pertinent test results, reviewed documented beta blocker date and time   Airway Mallampati: II  TM Distance: >3 FB Neck ROM: full    Dental no notable dental hx. (+) Teeth Intact, Dental Advisory Given   Pulmonary neg pulmonary ROS,    Pulmonary exam normal breath sounds clear to auscultation       Cardiovascular hypertension, Normal cardiovascular exam Rhythm:regular Rate:Normal     Neuro/Psych negative neurological ROS  negative psych ROS   GI/Hepatic negative GI ROS, Neg liver ROS,   Endo/Other  Morbid obesity  Renal/GU negative Renal ROS  negative genitourinary   Musculoskeletal   Abdominal (+) + obese,   Peds  Hematology negative hematology ROS (+)   Anesthesia Other Findings   Reproductive/Obstetrics (+) Pregnancy                             Anesthesia Physical Anesthesia Plan  ASA: 3  Anesthesia Plan: Epidural   Post-op Pain Management:    Induction:   PONV Risk Score and Plan: 2  Airway Management Planned: Natural Airway  Additional Equipment: None  Intra-op Plan:   Post-operative Plan:   Informed Consent: I have reviewed the patients History and Physical, chart, labs and discussed the procedure including the risks, benefits and alternatives for the proposed anesthesia with the patient or authorized representative who has indicated his/her understanding and acceptance.       Plan Discussed with: Anesthesiologist and CRNA  Anesthesia Plan Comments:         Anesthesia Quick Evaluation

## 2020-07-19 LAB — COMPREHENSIVE METABOLIC PANEL
ALT: 22 U/L (ref 0–44)
AST: 25 U/L (ref 15–41)
Albumin: 2.7 g/dL — ABNORMAL LOW (ref 3.5–5.0)
Alkaline Phosphatase: 105 U/L (ref 38–126)
Anion gap: 8 (ref 5–15)
BUN: 8 mg/dL (ref 6–20)
CO2: 22 mmol/L (ref 22–32)
Calcium: 8.9 mg/dL (ref 8.9–10.3)
Chloride: 105 mmol/L (ref 98–111)
Creatinine, Ser: 0.74 mg/dL (ref 0.44–1.00)
GFR, Estimated: >60 mL/min (ref >60)
Glucose, Bld: 105 mg/dL — ABNORMAL HIGH (ref 70–99)
Potassium: 3.8 mmol/L (ref 3.5–5.1)
Sodium: 135 mmol/L (ref 135–145)
Total Bilirubin: 0.7 mg/dL (ref 0.3–1.2)
Total Protein: 6 g/dL — ABNORMAL LOW (ref 6.5–8.1)

## 2020-07-19 LAB — CBC
HCT: 37.5 % (ref 36.0–46.0)
Hemoglobin: 12.2 g/dL (ref 12.0–15.0)
MCH: 28.7 pg (ref 26.0–34.0)
MCHC: 32.5 g/dL (ref 30.0–36.0)
MCV: 88.2 fL (ref 80.0–100.0)
Platelets: 183 10*3/uL (ref 150–400)
RBC: 4.25 MIL/uL (ref 3.87–5.11)
RDW: 14.4 % (ref 11.5–15.5)
WBC: 18 10*3/uL — ABNORMAL HIGH (ref 4.0–10.5)
nRBC: 0 % (ref 0.0–0.2)

## 2020-07-19 MED ORDER — ONDANSETRON HCL 4 MG PO TABS: 4.0000 mg | ORAL_TABLET | ORAL | Status: DC | PRN

## 2020-07-19 MED ORDER — SIMETHICONE 80 MG PO CHEW: 80.0000 mg | CHEWABLE_TABLET | ORAL | Status: DC | PRN

## 2020-07-19 MED ORDER — ACETAMINOPHEN 325 MG PO TABS: 650.0000 mg | ORAL_TABLET | ORAL | Status: DC | PRN

## 2020-07-19 MED ORDER — WITCH HAZEL-GLYCERIN EX PADS: 1.0000 "application " | MEDICATED_PAD | CUTANEOUS | Status: DC | PRN

## 2020-07-19 MED ORDER — TETANUS-DIPHTH-ACELL PERTUSSIS 5-2.5-18.5 LF-MCG/0.5 IM SUSY: 0.5000 mL | PREFILLED_SYRINGE | Freq: Once | INTRAMUSCULAR | Status: DC

## 2020-07-19 MED ORDER — DIPHENHYDRAMINE HCL 25 MG PO CAPS: 25.0000 mg | ORAL_CAPSULE | Freq: Four times a day (QID) | ORAL | Status: DC | PRN

## 2020-07-19 MED ORDER — ONDANSETRON HCL 4 MG/2ML IJ SOLN: 4.0000 mg | INTRAMUSCULAR | Status: DC | PRN

## 2020-07-19 MED ORDER — DIBUCAINE (PERIANAL) 1 % EX OINT: 1.0000 "application " | TOPICAL_OINTMENT | CUTANEOUS | Status: DC | PRN

## 2020-07-19 MED ORDER — BENZOCAINE-MENTHOL 20-0.5 % EX AERO: 1.0000 "application " | INHALATION_SPRAY | CUTANEOUS | Status: DC | PRN

## 2020-07-19 MED ORDER — SENNOSIDES-DOCUSATE SODIUM 8.6-50 MG PO TABS
2.0000 | ORAL_TABLET | Freq: Every day | ORAL | Status: DC
  Administered 2020-07-20 – 2020-07-21 (×2): 2 via ORAL
  Filled 2020-07-19 (×2): qty 2

## 2020-07-19 MED ORDER — ZOLPIDEM TARTRATE 5 MG PO TABS: 5.0000 mg | ORAL_TABLET | Freq: Every evening | ORAL | Status: DC | PRN

## 2020-07-19 MED ORDER — COCONUT OIL OIL: 1.0000 "application " | TOPICAL_OIL | Status: DC | PRN

## 2020-07-19 MED ORDER — IBUPROFEN 600 MG PO TABS
600.0000 mg | ORAL_TABLET | Freq: Four times a day (QID) | ORAL | Status: DC
  Administered 2020-07-19 – 2020-07-21 (×9): 600 mg via ORAL
  Filled 2020-07-19 (×9): qty 1

## 2020-07-19 MED ORDER — PRENATAL MULTIVITAMIN CH
1.0000 | ORAL_TABLET | Freq: Every day | ORAL | Status: DC
  Administered 2020-07-19 – 2020-07-21 (×3): 1 via ORAL
  Filled 2020-07-19 (×3): qty 1

## 2020-07-19 NOTE — Lactation Note (Signed)
This note was copied from a baby's chart. Lactation Consultation Note Baby rooting and crying wanting to BF. Mom has pendulous breast. LC positioned pillows bedside mom for football hold. Baby latched well. LC flanged lips wider. Baby feeding well. Mom denies painful latch. Will f/u mom on MBU. Patient Name: Girl [place] Dohmen [id number] Date: 07/19/2020 Reason for consult: L&D Initial assessment;Term Age:35 hours  Maternal Data Has patient been taught Hand Expression?: Yes Does the patient have breastfeeding experience prior to this delivery?: Yes How long did the patient breastfeed?: 3 months  Feeding    LATCH Score Latch: Grasps breast easily, tongue down, lips flanged, rhythmical sucking.  Audible Swallowing: A few with stimulation  Type of Nipple: Everted at rest and after stimulation (short shaft)  Comfort (Breast/Nipple): Soft / non-tender  Hold (Positioning): Assistance needed to correctly position infant at breast and maintain latch.  LATCH Score: 8   Lactation Tools Discussed/Used    Interventions Interventions: Breast feeding basics reviewed;Adjust position;Assisted with latch;Support pillows;Skin to skin;Position options;Breast massage;Breast compression  Discharge    Consult Status Consult Status: Follow-up Date: 07/19/20 Follow-up type: In-patient    Saja Bartolini, Elta Guadeloupe 07/19/2020, 4:02 AM

## 2020-07-19 NOTE — Lactation Note (Signed)
This note was copied from a baby's chart. Lactation Consultation Note  Patient Name: Girl Scarlette Hogston UNHRV'A Date: 07/19/2020 Reason for consult: Follow-up assessment;Mother's request;Difficult latch;Term Age:35 hours  LC came in assisting Mom latching infant in football hold on the left breast. Mom large dense breast, LC assisted laying breast on pillow and latching infant on her side with breast compression. Infant showed signs of milk transfer through 10 minute observation of the feeding.   Mom supplementing with formula. Volume supplementation guide provided and reviewed based on hrs of age since delivery. If infant latches Mom to offer less supplement and if does not latch, to offer more. Mom aware offer EBM first separate bottle followed by formula.   Parents requested set up DEBP since was not getting much with manual.  Mom pumping q 3hrs with DEBP for 15 min after latching.   All questions answered at the end of the visit.    Maternal Data    Feeding Mother's Current Feeding Choice: Breast Milk and Formula Nipple Type: Slow - flow  LATCH Score Latch: Repeated attempts needed to sustain latch, nipple held in mouth throughout feeding, stimulation needed to elicit sucking reflex.  Audible Swallowing: Spontaneous and intermittent  Type of Nipple: Everted at rest and after stimulation  Comfort (Breast/Nipple): Soft / non-tender  Hold (Positioning): Assistance needed to correctly position infant at breast and maintain latch.  LATCH Score: 8   Lactation Tools Discussed/Used Tools: Pump;Flanges Flange Size: 27 Breast pump type: Double-Electric Breast Pump Pump Education: Setup, frequency, and cleaning;Milk Storage Reason for Pumping: increase stimulation Pumping frequency: every 3 hrs for 15 min  Interventions Interventions: Breast feeding basics reviewed;Breast compression;Assisted with latch;Adjust position;Skin to skin;Support pillows;DEBP;Breast massage;Hand  express;Expressed milk;Education  Discharge    Consult Status Consult Status: Follow-up Date: 07/20/20 Follow-up type: In-patient    Advika Mclelland  Nicholson-Springer 07/19/2020, 11:23 PM

## 2020-07-19 NOTE — Anesthesia Postprocedure Evaluation (Signed)
Anesthesia Post Note  Patient: Mary Mckinney  Procedure(s) Performed: AN AD HOC LABOR EPIDURAL     Patient location during evaluation: Mother Baby Anesthesia Type: Epidural Level of consciousness: awake, awake and alert and oriented Pain management: pain level controlled Vital Signs Assessment: post-procedure vital signs reviewed and stable Respiratory status: spontaneous breathing and respiratory function stable Cardiovascular status: blood pressure returned to baseline Postop Assessment: no headache, epidural receding, patient able to bend at knees, adequate PO intake, no backache, no apparent nausea or vomiting and able to ambulate Anesthetic complications: no   No notable events documented.  Last Vitals:  Vitals:   07/19/20 1013 07/19/20 1100  BP: (!) 124/57 134/76  Pulse: 90 93  Resp: 18   Temp: 37.2 C   SpO2:      Last Pain:  Vitals:   07/19/20 1515  TempSrc:   PainSc: 0-No pain   Pain Goal:                   Bufford Spikes

## 2020-07-20 LAB — CULTURE, OB URINE: Culture: NO GROWTH

## 2020-07-20 NOTE — Progress Notes (Signed)
PPD# 1SVD w/ intact perineum Information for the patient's newborn:  Mary Mckinney, Mary Mckinney [[id number]]  female    S:   Reports feeling "really good" Tolerating PO fluid and solids No nausea or vomiting Bleeding is light Pain controlled with ibuprofen (OTC) Up ad lib / ambulatory / voiding w/o difficulty Feeding: Bottle and Breast    O:   VS: BP (!) 101/50 (BP Location: Right Arm)   Pulse (!) 55   Temp 97.8 F (36.6 C) (Oral)   Resp 18   Ht 5\' 4"  (1.626 m)   Wt 130.2 kg   SpO2 100%   BMI 49.28 kg/m   LABS:  Recent Labs    07/18/20 1852 07/19/20 0525  WBC 9.2 18.0*  HGB 13.4 12.2  PLT 190 183   Blood type: --/--/O POS (06/22 0230) Rubella: Immune (02/10 0000)                      I&O: Intake/Output      06/23 0701 06/24 0700 06/24 0701 06/25 0700   P.O.     Total Intake(mL/kg)     Urine (mL/kg/hr)     Blood     Total Output     Net            Physical Exam: Alert and oriented X3 Lungs: Clear and unlabored Heart: regular rate and rhythm / no mumurs Abdomen: soft, non-tender, non-distended  Fundus: firm, non-tender, U/1 Perineum: intact Lochia: minimal Extremities: negative edema, no calf pain or tenderness    A:  PPD # 1  Normal exam  P:  Routine post partum orders  Anticipate D/C on 07/21/20   Plan reviewed w/ Dr. Cain Sieve, MSN, CNM 07/20/2020, 8:49 AM

## 2020-07-21 ENCOUNTER — Encounter (HOSPITAL_COMMUNITY): Payer: Self-pay

## 2020-07-21 MED ORDER — PRENATAL MULTIVITAMIN CH: 1.0000 | ORAL_TABLET | Freq: Every day | ORAL | Status: AC

## 2020-07-21 MED ORDER — IBUPROFEN 600 MG PO TABS: 600.0000 mg | ORAL_TABLET | Freq: Four times a day (QID) | ORAL | 0 refills | Status: AC

## 2020-07-21 MED ORDER — ACETAMINOPHEN 325 MG PO TABS: 650.0000 mg | ORAL_TABLET | ORAL | Status: AC | PRN

## 2020-07-21 NOTE — Lactation Note (Signed)
This note was copied from a baby's chart. Lactation Consultation Note  Patient Name: Mary Mckinney [id number] Date: 07/21/2020 Reason for consult: Follow-up assessment Age:35 hours   P2 mother whose infant is now 51 hours old.  This is a term baby at 39+1 weeks.  Mother is breast feeding and supplementing with formula.  Baby was asleep on father's chest when I arrived.  Mother had a couple of lactation questions which I answered to her satisfaction.  Reviewed engorgement prevention/treatment and jaundice levels.  Mother has a manual pump and a DEBP for home use. She has our OP phone number for any questions after discharge.     Maternal Data    Feeding    LATCH Score                    Lactation Tools Discussed/Used    Interventions Interventions: Education  Discharge Discharge Education: Engorgement and breast care  Consult Status Consult Status: Complete Date: 07/21/20 Follow-up type: Call as needed    Lanice Schwab Baneza Bartoszek 07/21/2020, 9:14 AM

## 2020-07-21 NOTE — Discharge Summary (Signed)
Postpartum Discharge Summary  Date of Service updated 07/21/20    Patient Name: Mary Mckinney DOB: 1986/01/13 MRN: [id number]  Date of admission: 07/18/2020 Delivery date:07/19/2020  Delivering provider: Noralyn Pick  Date of discharge: 07/21/2020  Admitting diagnosis: Chronic hypertension [I10] Intrauterine pregnancy: [redacted]w[redacted]d    Secondary diagnosis:  Active Problems:   Chronic hypertension   SVD (spontaneous vaginal delivery)  Additional problems: none    Discharge diagnosis: Term Pregnancy Delivered                                              Post partum procedures: none Augmentation: AROM, Pitocin, and Cytotec Complications: None  Hospital course: Induction of Labor With Vaginal Delivery   35y.o. yo [id number] admitted to the hospital 07/18/2020 for induction of labor.  Indication for induction:  chronic hypertension without medication .  Patient had an uncomplicated labor course as follows: Membrane Rupture Time/Date: 2:02 PM ,07/18/2020   Delivery Method:Vaginal, Spontaneous  Episiotomy: None  Lacerations:  None  Details of delivery can be found in separate delivery note.  Patient had a routine postpartum course. Patient is discharged home 07/21/20.  Newborn Data: Birth date:07/19/2020  Birth time:3:04 AM  Gender:Female  Living status:Living  Apgars:9 ,9  Weight:3060 g   Magnesium Sulfate received: No BMZ received: No Rhophylac:N/A MMR:N/A Transfusion:No  Physical exam  Vitals:   07/20/20 1320 07/20/20 2237 07/21/20 0015 07/21/20 0500  BP: (!) 100/57 (!) 113/51  111/60  Pulse: 88 63  67  Resp:  18  18  Temp: 98.8 F (37.1 C)  98.6 F (37 C) 98.3 F (36.8 C)  TempSrc: Oral  Oral Oral  SpO2: 100%  100% 100%  Weight:      Height:       General: alert, cooperative, and no distress Lochia: appropriate Uterine Fundus: firm Incision: N/A DVT Evaluation: No evidence of DVT seen on physical exam. No cords or calf tenderness. No significant  calf/ankle edema. Labs: Lab Results  Component Value Date   WBC 18.0 (H) 07/19/2020   HGB 12.2 07/19/2020   HCT 37.5 07/19/2020   MCV 88.2 07/19/2020   PLT 183 07/19/2020   CMP Latest Ref Rng & Units 07/19/2020  Glucose 70 - 99 mg/dL 105(H)  BUN 6 - 20 mg/dL 8  Creatinine 0.44 - 1.00 mg/dL 0.74  Sodium 135 - 145 mmol/L 135  Potassium 3.5 - 5.1 mmol/L 3.8  Chloride 98 - 111 mmol/L 105  CO2 22 - 32 mmol/L 22  Calcium 8.9 - 10.3 mg/dL 8.9  Total Protein 6.5 - 8.1 g/dL 6.0(L)  Total Bilirubin 0.3 - 1.2 mg/dL 0.7  Alkaline Phos 38 - 126 U/L 105  AST 15 - 41 U/L 25  ALT 0 - 44 U/L 22   Edinburgh Score: Edinburgh Postnatal Depression Scale Screening Tool 07/20/2020  I have been able to laugh and see the funny side of things. 0  I have looked forward with enjoyment to things. 0  I have blamed myself unnecessarily when things went wrong. 1  I have been anxious or worried for no good reason. 2  I have felt scared or panicky for no good reason. 1  Things have been getting on top of me. 1  I have been so unhappy that I have had difficulty sleeping. 0  I have felt sad or miserable. 1  I have been so unhappy that I have been crying. 1  The thought of harming myself has occurred to me. 0  Edinburgh Postnatal Depression Scale Total 7      After visit meds:  Allergies as of 07/21/2020   No Known Allergies      Medication List     STOP taking these medications    calcium carbonate 500 MG chewable tablet Commonly known as: TUMS - dosed in mg elemental calcium   docusate sodium 100 MG capsule Commonly known as: Colace   oxyCODONE-acetaminophen 5-325 MG tablet Commonly known as: Roxicet       TAKE these medications    acetaminophen 325 MG tablet Commonly known as: Tylenol Take 2 tablets (650 mg total) by mouth every 4 (four) hours as needed (for pain scale < 4).   cetirizine 10 MG tablet Commonly known as: ZYRTEC Take 10 mg daily by mouth.   fluticasone 50 MCG/ACT  nasal spray Commonly known as: FLONASE Place 2 sprays daily into both nostrils.   ibuprofen 600 MG tablet Commonly known as: ADVIL Take 1 tablet (600 mg total) by mouth every 6 (six) hours. What changed:  medication strength how much to take when to take this reasons to take this   prenatal multivitamin Tabs tablet Take 1 tablet by mouth daily at 12 noon. Start taking on: July 22, 2020   pseudoephedrine 120 MG 12 hr tablet Commonly known as: SUDAFED Take 120 mg 2 (two) times daily by mouth.         Discharge home in stable condition Infant Feeding: Bottle and Breast Infant Disposition:home with mother Discharge instruction: per After Visit Summary and Postpartum booklet. Activity: Advance as tolerated. Pelvic rest for 6 weeks.  Diet: routine diet Anticipated Birth Control: IUD Postpartum Appointment:6 weeks Additional Postpartum F/U: BP check 1 week Future Appointments:No future appointments. Follow up Visit:  [facility]. Go in 6 week(s).   Specialty: Obstetrics and Gynecology Contact information: [address] [place] [address] [phone]                    07/21/2020 Arrie Eastern, CNM

## 2020-07-31 ENCOUNTER — Telehealth (HOSPITAL_COMMUNITY): Payer: Self-pay | Admitting: *Deleted

## 2020-07-31 NOTE — Telephone Encounter (Signed)
Left message for patient to return RN's discharge follow-up call. Erline Levine, RN, 08/01/20, [phone]

## 2020-08-24 ENCOUNTER — Emergency Department (HOSPITAL_COMMUNITY)
Admission: EM | Admit: 2020-08-24 | Discharge: 2020-08-25 | Disposition: A | Discharge status: Home / Self Care | Payer: BC Managed Care – PPO | Attending: Emergency Medicine | Admitting: Emergency Medicine

## 2020-08-24 ENCOUNTER — Encounter (HOSPITAL_COMMUNITY): Payer: Self-pay

## 2020-08-24 ENCOUNTER — Other Ambulatory Visit: Payer: Self-pay

## 2020-08-24 DIAGNOSIS — N939 Abnormal uterine and vaginal bleeding, unspecified: Secondary | ICD-10-CM | POA: Diagnosis not present

## 2020-08-24 DIAGNOSIS — Z5321 Procedure and treatment not carried out due to patient leaving prior to being seen by health care provider: Secondary | ICD-10-CM | POA: Insufficient documentation

## 2020-08-24 LAB — I-STAT BETA HCG BLOOD, ED (MC, WL, AP ONLY): I-stat hCG, quantitative: <5 m[IU]/mL (ref <5)

## 2020-08-24 LAB — CBC
HCT: 42.7 % (ref 36.0–46.0)
Hemoglobin: 13.7 g/dL (ref 12.0–15.0)
MCH: 28.6 pg (ref 26.0–34.0)
MCHC: 32.1 g/dL (ref 30.0–36.0)
MCV: 89.1 fL (ref 80.0–100.0)
Platelets: 224 10*3/uL (ref 150–400)
RBC: 4.79 MIL/uL (ref 3.87–5.11)
RDW: 14 % (ref 11.5–15.5)
WBC: 6.1 10*3/uL (ref 4.0–10.5)
nRBC: 0 % (ref 0.0–0.2)

## 2020-08-24 LAB — I-STAT CHEM 8, ED
BUN: 15 mg/dL (ref 6–20)
Calcium, Ion: 1.27 mmol/L (ref 1.15–1.40)
Chloride: 107 mmol/L (ref 98–111)
Creatinine, Ser: 0.9 mg/dL (ref 0.44–1.00)
Glucose, Bld: 82 mg/dL (ref 70–99)
HCT: 43 % (ref 36.0–46.0)
Hemoglobin: 14.6 g/dL (ref 12.0–15.0)
Potassium: 4.1 mmol/L (ref 3.5–5.1)
Sodium: 142 mmol/L (ref 135–145)
TCO2: 25 mmol/L (ref 22–32)

## 2020-08-24 NOTE — ED Provider Notes (Signed)
Emergency Medicine Provider Triage Evaluation Note  Mary Mckinney , a 35 y.o. female  was evaluated in triage.  Pt complains of vaginal bleeding.  Review of Systems  Positive: Lightheadedness, vaginal bleeding Negative: Fever, abd pain, back pain, hematuria  Physical Exam  BP (!) 134/100 (BP Location: Left Arm)   Pulse 83   Temp 98 F (36.7 C) (Oral)   Resp 15   SpO2 99%  Gen:   Awake, no distress   Resp:  Normal effort  MSK:   Moves extremities without difficulty  Other:    Medical Decision Making  Medically screening exam initiated at 3:32 PM.  Appropriate orders placed.  [name] was informed that the remainder of the evaluation will be completed by another provider, this initial triage assessment does not replace that evaluation, and the importance of remaining in the ED until their evaluation is complete.  Patient had a vaginal delivery on 6/23 without any complication.  Was seen by her OB/GYN for follow-up 4 days ago and had a pelvic exam.  Has had persistent vaginal bleeding since prompting this ER visit.  No significant abdominal pain at this time no fever.  Does report that she is noticing increasing heavy vaginal bleeding each day with clots   Domenic Moras, PA-C 08/24/20 1534    Hayden Rasmussen, MD 08/24/20 [phone]

## 2020-08-24 NOTE — ED Notes (Signed)
Pt was called to update vital signs but no response. x2

## 2020-08-24 NOTE — ED Notes (Signed)
Pt called to update vital signs with no answer.

## 2020-08-24 NOTE — ED Triage Notes (Signed)
Pt reports having a vaginal birth on 6/23. Pt reports mild spotting afterwards that eventually stopped. After follow-up appoint on Tuesday she began having heavy vaginal bleeding wit clots. Pt denies abdominal pain, N/V, and abnormal vaginal discharge.

## 2020-08-27 DIAGNOSIS — Z30430 Encounter for insertion of intrauterine contraceptive device: Secondary | ICD-10-CM | POA: Diagnosis not present

## 2021-06-18 ENCOUNTER — Telehealth: Payer: Medicaid Other | Admitting: Family Medicine

## 2021-06-18 DIAGNOSIS — J019 Acute sinusitis, unspecified: Secondary | ICD-10-CM | POA: Diagnosis not present

## 2021-06-18 DIAGNOSIS — B9689 Other specified bacterial agents as the cause of diseases classified elsewhere: Secondary | ICD-10-CM | POA: Diagnosis not present

## 2021-06-18 MED ORDER — AMOXICILLIN-POT CLAVULANATE 875-125 MG PO TABS: 1.0000 | ORAL_TABLET | Freq: Two times a day (BID) | ORAL | 0 refills | Status: AC

## 2021-06-18 MED ORDER — PROMETHAZINE-DM 6.25-15 MG/5ML PO SYRP: 5.0000 mL | ORAL_SOLUTION | Freq: Three times a day (TID) | ORAL | 0 refills | Status: AC | PRN

## 2021-06-18 NOTE — Progress Notes (Signed)
Virtual Visit Consent   Mary Mckinney, you are scheduled for a virtual visit with a [facility] provider today. Just as with appointments in the office, your consent must be obtained to participate. Your consent will be active for this visit and any virtual visit you may have with one of our providers in the next 365 days. If you have a MyChart account, a copy of this consent can be sent to you electronically.  As this is a virtual visit, video technology does not allow for your provider to perform a traditional examination. This may limit your provider's ability to fully assess your condition. If your provider identifies any concerns that need to be evaluated in person or the need to arrange testing (such as labs, EKG, etc.), we will make arrangements to do so. Although advances in technology are sophisticated, we cannot ensure that it will always work on either your end or our end. If the connection with a video visit is poor, the visit may have to be switched to a telephone visit. With either a video or telephone visit, we are not always able to ensure that we have a secure connection.  By engaging in this virtual visit, you consent to the provision of healthcare and authorize for your insurance to be billed (if applicable) for the services provided during this visit. Depending on your insurance coverage, you may receive a charge related to this service.  I need to obtain your verbal consent now. Are you willing to proceed with your visit today? Mary Mckinney has provided verbal consent on 06/18/2021 for a virtual visit (video or telephone). Mary Mayo, NP  Date: 06/18/2021 1:17 PM  Virtual Visit via Video Note   I, Mary Mckinney, connected with  Mary Mckinney  ([id number], 07/27/1985) on 06/18/21 at  1:15 PM EDT by a video-enabled telemedicine application and verified that I am speaking with the correct person using two identifiers.  Location: Patient: Virtual Visit Location Patient:  Home Provider: Virtual Visit Location Provider: Home Office   I discussed the limitations of evaluation and management by telemedicine and the availability of in person appointments. The patient expressed understanding and agreed to proceed.    History of Present Illness: Mary Mckinney is a 36 y.o. who identifies as a female who was assigned female at birth, and is being seen today for sinus congestion.  HPI: Sinus Problem This is a new problem. The current episode started 1 to 4 weeks ago. The problem has been gradually worsening since onset. The maximum temperature recorded prior to her arrival was 100.4 - 100.9 F. The fever has been present for Less than 1 day. Associated symptoms include congestion, coughing, a hoarse voice, shortness of breath, sinus pressure, sneezing and a sore throat. Pertinent negatives include no chills, diaphoresis, ear pain, headaches, neck pain or swollen glands. Past treatments include acetaminophen, oral decongestants, saline sprays and spray decongestants. The treatment provided mild relief.   Problems:  Patient Active Problem List   Diagnosis Date Noted   SVD (spontaneous vaginal delivery) 07/19/2020   Chronic hypertension 07/18/2020    Allergies: No Known Allergies Medications:  Current Outpatient Medications:    acetaminophen (TYLENOL) 325 MG tablet, Take 2 tablets (650 mg total) by mouth every 4 (four) hours as needed (for pain scale < 4)., Disp: , Rfl:    cetirizine (ZYRTEC) 10 MG tablet, Take 10 mg daily by mouth., Disp: , Rfl:    fluticasone (FLONASE) 50 MCG/ACT nasal spray,  Place 2 sprays daily into both nostrils., Disp: , Rfl:    ibuprofen (ADVIL) 600 MG tablet, Take 1 tablet (600 mg total) by mouth every 6 (six) hours., Disp: 30 tablet, Rfl: 0   Prenatal Vit-Fe Fumarate-FA (PRENATAL MULTIVITAMIN) TABS tablet, Take 1 tablet by mouth daily at 12 noon., Disp: , Rfl:    pseudoephedrine (SUDAFED) 120 MG 12 hr tablet, Take 120 mg 2 (two) times daily by  mouth., Disp: , Rfl:   Observations/Objective: Patient is well-developed, well-nourished in no acute distress.  Resting comfortably  at home.  Head is normocephalic, atraumatic.  No labored breathing.  Speech is clear and coherent with logical content.  Patient is alert and oriented at baseline.  Nasal congestion Cough present   Assessment and Plan: 1. Acute bacterial sinusitis  - amoxicillin-clavulanate (AUGMENTIN) 875-125 MG tablet; Take 1 tablet by mouth 2 (two) times daily for 7 days.  Dispense: 14 tablet; Refill: 0 - promethazine-dextromethorphan (PROMETHAZINE-DM) 6.25-15 MG/5ML syrup; Take 5 mLs by mouth 3 (three) times daily as needed for cough.  Dispense: 118 mL; Refill: 0  S&S are consistent with viral/allergy infection turned bacterial Tx as per above with OTC discussed   Reviewed side effects, risks and benefits of medication.    Patient acknowledged agreement and understanding of the plan.     Follow Up Instructions: I discussed the assessment and treatment plan with the patient. The patient was provided an opportunity to ask questions and all were answered. The patient agreed with the plan and demonstrated an understanding of the instructions.  A copy of instructions were sent to the patient via MyChart unless otherwise noted below.   The patient was advised to call back or seek an in-person evaluation if the symptoms worsen or if the condition fails to improve as anticipated.  Time:  I spent 15 minutes with the patient via telehealth technology discussing the above problems/concerns.    Mary Mayo, NP

## 2021-06-18 NOTE — Patient Instructions (Signed)

## 2021-12-19 ENCOUNTER — Telehealth: Payer: Medicaid Other | Admitting: Family Medicine

## 2021-12-19 DIAGNOSIS — R3989 Other symptoms and signs involving the genitourinary system: Secondary | ICD-10-CM | POA: Diagnosis not present

## 2021-12-19 MED ORDER — NITROFURANTOIN MONOHYD MACRO 100 MG PO CAPS: 100.0000 mg | ORAL_CAPSULE | Freq: Two times a day (BID) | ORAL | 0 refills | Status: AC

## 2021-12-19 NOTE — Progress Notes (Signed)
Virtual Visit Consent   [name], you are scheduled for a virtual visit with a [facility] provider today. Just as with appointments in the office, your consent must be obtained to participate. Your consent will be active for this visit and any virtual visit you may have with one of our providers in the next 365 days. If you have a MyChart account, a copy of this consent can be sent to you electronically.  As this is a virtual visit, video technology does not allow for your provider to perform a traditional examination. This may limit your provider's ability to fully assess your condition. If your provider identifies any concerns that need to be evaluated in person or the need to arrange testing (such as labs, EKG, etc.), we will make arrangements to do so. Although advances in technology are sophisticated, we cannot ensure that it will always work on either your end or our end. If the connection with a video visit is poor, the visit may have to be switched to a telephone visit. With either a video or telephone visit, we are not always able to ensure that we have a secure connection.  By engaging in this virtual visit, you consent to the provision of healthcare and authorize for your insurance to be billed (if applicable) for the services provided during this visit. Depending on your insurance coverage, you may receive a charge related to this service.  I need to obtain your verbal consent now. Are you willing to proceed with your visit today? [name] has provided verbal consent on 12/19/2021 for a virtual visit (video or telephone). Mary Mayo, NP  Date: 12/19/2021 10:25 AM  Virtual Visit via Video Note   I, Mary Mckinney, connected with  Mary Mckinney  ([id number], 1985/07/01) on 12/19/21 at 10:30 AM EST by a video-enabled telemedicine application and verified that I am speaking with the correct person using two identifiers.  Location: Patient: Virtual Visit Location Patient:  Home Provider: Virtual Visit Location Provider: Home Office   I discussed the limitations of evaluation and management by telemedicine and the availability of in person appointments. The patient expressed understanding and agreed to proceed.    History of Present Illness: Mary Mckinney is a 36 y.o. who identifies as a female who was assigned female at birth, and is being seen today for UTI.   HPI: Urinary Tract Infection  This is a new problem. The current episode started in the past 7 days. The problem occurs every urination. The problem has been gradually worsening. The quality of the pain is described as burning. There has been no fever. She is Not sexually active. There is No history of pyelonephritis. Associated symptoms include frequency, hesitancy and urgency. Pertinent negatives include no chills, discharge, flank pain, hematuria, nausea, possible pregnancy, sweats or vomiting. Treatments tried: AZO. The treatment provided no relief.    Problems:  Patient Active Problem List   Diagnosis Date Noted   SVD (spontaneous vaginal delivery) 07/19/2020   Chronic hypertension 07/18/2020    Allergies: No Known Allergies Medications:  Current Outpatient Medications:    acetaminophen (TYLENOL) 325 MG tablet, Take 2 tablets (650 mg total) by mouth every 4 (four) hours as needed (for pain scale < 4)., Disp: , Rfl:    cetirizine (ZYRTEC) 10 MG tablet, Take 10 mg daily by mouth., Disp: , Rfl:    fluticasone (FLONASE) 50 MCG/ACT nasal spray, Place 2 sprays daily into both nostrils., Disp: , Rfl:  ibuprofen (ADVIL) 600 MG tablet, Take 1 tablet (600 mg total) by mouth every 6 (six) hours., Disp: 30 tablet, Rfl: 0   Prenatal Vit-Fe Fumarate-FA (PRENATAL MULTIVITAMIN) TABS tablet, Take 1 tablet by mouth daily at 12 noon., Disp: , Rfl:    promethazine-dextromethorphan (PROMETHAZINE-DM) 6.25-15 MG/5ML syrup, Take 5 mLs by mouth 3 (three) times daily as needed for cough., Disp: 118 mL, Rfl: 0    pseudoephedrine (SUDAFED) 120 MG 12 hr tablet, Take 120 mg 2 (two) times daily by mouth., Disp: , Rfl:   Observations/Objective: Patient is well-developed, well-nourished in no acute distress.  Resting comfortably  at home.  Head is normocephalic, atraumatic.  No labored breathing.  Speech is clear and coherent with logical content.  Patient is alert and oriented at baseline.   Assessment and Plan:  1. Suspected UTI  - nitrofurantoin, macrocrystal-monohydrate, (MACROBID) 100 MG capsule; Take 1 capsule (100 mg total) by mouth 2 (two) times daily for 5 days.  Dispense: 10 capsule; Refill: 0  -UTI suspected, education info on AVS   Reviewed side effects, risks and benefits of medication.    Patient acknowledged agreement and understanding of the plan.   Past Medical, Surgical, Social History, Allergies, and Medications have been Reviewed.    Follow Up Instructions: I discussed the assessment and treatment plan with the patient. The patient was provided an opportunity to ask questions and all were answered. The patient agreed with the plan and demonstrated an understanding of the instructions.  A copy of instructions were sent to the patient via MyChart unless otherwise noted below.     The patient was advised to call back or seek an in-person evaluation if the symptoms worsen or if the condition fails to improve as anticipated.  Time:  I spent 7 minutes with the patient via telehealth technology discussing the above problems/concerns.    Mary Mayo, NP

## 2021-12-19 NOTE — Patient Instructions (Addendum)
[name], thank you for joining [name], NP for today's virtual visit.  While this provider is not your primary care provider (PCP), if your PCP is located in our provider database this encounter information will be shared with them immediately following your visit.   [facility] account gives you access to today's visit and all your visits, tests, and labs performed at [facility] " click here if you don't have a [facility] account or go to [email or web address]  Consent: (Patient) Mary Mckinney provided verbal consent for this virtual visit at the beginning of the encounter.  Current Medications:  Current Outpatient Medications:    acetaminophen (TYLENOL) 325 MG tablet, Take 2 tablets (650 mg total) by mouth every 4 (four) hours as needed (for pain scale < 4)., Disp: , Rfl:    cetirizine (ZYRTEC) 10 MG tablet, Take 10 mg daily by mouth., Disp: , Rfl:    fluticasone (FLONASE) 50 MCG/ACT nasal spray, Place 2 sprays daily into both nostrils., Disp: , Rfl:    ibuprofen (ADVIL) 600 MG tablet, Take 1 tablet (600 mg total) by mouth every 6 (six) hours., Disp: 30 tablet, Rfl: 0   Prenatal Vit-Fe Fumarate-FA (PRENATAL MULTIVITAMIN) TABS tablet, Take 1 tablet by mouth daily at 12 noon., Disp: , Rfl:    promethazine-dextromethorphan (PROMETHAZINE-DM) 6.25-15 MG/5ML syrup, Take 5 mLs by mouth 3 (three) times daily as needed for cough., Disp: 118 mL, Rfl: 0   pseudoephedrine (SUDAFED) 120 MG 12 hr tablet, Take 120 mg 2 (two) times daily by mouth., Disp: , Rfl:    Medications ordered in this encounter:  No orders of the defined types were placed in this encounter.    *If you need refills on other medications prior to your next appointment, please contact your pharmacy*  Follow-Up: Call back or seek an in-person evaluation if the symptoms worsen or if the condition fails to improve as anticipated.  [facility] [phone]  Other  Instructions Urinary Tract Infection, Adult A urinary tract infection (UTI) is an infection of any part of the urinary tract. The urinary tract includes: The kidneys. The ureters. The bladder. The urethra. These organs make, store, and get rid of pee (urine) in the body. What are the causes? This infection is caused by germs (bacteria) in your genital area. These germs grow and cause swelling (inflammation) of your urinary tract. What increases the risk? The following factors may make you more likely to develop this condition: Using a small, thin tube (catheter) to drain pee. Not being able to control when you pee or poop (incontinence). Being female. If you are female, these things can increase the risk: Using these methods to prevent pregnancy: A medicine that kills sperm (spermicide). A device that blocks sperm (diaphragm). Having low levels of a female hormone (estrogen). Being pregnant. You are more likely to develop this condition if: You have genes that add to your risk. You are sexually active. You take antibiotic medicines. You have trouble peeing because of: A prostate that is bigger than normal, if you are female. A blockage in the part of your body that drains pee from the bladder. A kidney stone. A nerve condition that affects your bladder. Not getting enough to drink. Not peeing often enough. You have other conditions, such as: Diabetes. A weak disease-fighting system (immune system). Sickle cell disease. Gout. Injury of the spine. What are the signs or symptoms? Symptoms of this condition include: Needing to pee  right away. Peeing small amounts often. Pain or burning when peeing. Blood in the pee. Pee that smells bad or not like normal. Trouble peeing. Pee that is cloudy. Fluid coming from the vagina, if you are female. Pain in the belly or lower back. Other symptoms include: Vomiting. Not feeling hungry. Feeling mixed up (confused). This may be the  first symptom in older adults. Being tired and grouchy (irritable). A fever. Watery poop (diarrhea). How is this treated? Taking antibiotic medicine. Taking other medicines. Drinking enough water. In some cases, you may need to see a specialist. Follow these instructions at home:  Medicines Take over-the-counter and prescription medicines only as told by your doctor. If you were prescribed an antibiotic medicine, take it as told by your doctor. Do not stop taking it even if you start to feel better. General instructions Make sure you: Pee until your bladder is empty. Do not hold pee for a long time. Empty your bladder after sex. Wipe from front to back after peeing or pooping if you are a female. Use each tissue one time when you wipe. Drink enough fluid to keep your pee pale yellow. Keep all follow-up visits. Contact a doctor if: You do not get better after 1-2 days. Your symptoms go away and then come back. Get help right away if: You have very bad back pain. You have very bad pain in your lower belly. You have a fever. You have chills. You feeling like you will vomit or you vomit. Summary A urinary tract infection (UTI) is an infection of any part of the urinary tract. This condition is caused by germs in your genital area. There are many risk factors for a UTI. Treatment includes antibiotic medicines. Drink enough fluid to keep your pee pale yellow. This information is not intended to replace advice given to you by your health care provider. Make sure you discuss any questions you have with your health care provider. Document Revised: 08/26/2019 Document Reviewed: 08/26/2019 Elsevier Patient Education  [place].    If you have been instructed to have an in-person evaluation today at a local Urgent Care facility, please use the link below. It will take you to a list of all of our available [facility], including address, phone number and hours of  operation. Please do not delay care.  [facility]  If you or a family member do not have a primary care provider, use the link below to schedule a visit and establish care. When you choose a [facility] primary care physician or advanced practice provider, you gain a long-term partner in health. Find a Primary Care Provider  Learn more about [facility] in-office and virtual care options: [facility]

## 2023-04-17 ENCOUNTER — Telehealth

## 2023-04-17 DIAGNOSIS — K047 Periapical abscess without sinus: Secondary | ICD-10-CM | POA: Diagnosis not present

## 2023-04-17 MED ORDER — AMOXICILLIN-POT CLAVULANATE 875-125 MG PO TABS: 1.0000 | ORAL_TABLET | Freq: Two times a day (BID) | ORAL | 0 refills | Status: AC

## 2023-04-17 NOTE — Progress Notes (Signed)
 Virtual Visit Consent   [name], you are scheduled for a virtual visit with a [facility] provider today. Just as with appointments in the office, your consent must be obtained to participate. Your consent will be active for this visit and any virtual visit you may have with one of our providers in the next 365 days. If you have a MyChart account, a copy of this consent can be sent to you electronically.  As this is a virtual visit, video technology does not allow for your provider to perform a traditional examination. This may limit your provider's ability to fully assess your condition. If your provider identifies any concerns that need to be evaluated in person or the need to arrange testing (such as labs, EKG, etc.), we will make arrangements to do so. Although advances in technology are sophisticated, we cannot ensure that it will always work on either your end or our end. If the connection with a video visit is poor, the visit may have to be switched to a telephone visit. With either a video or telephone visit, we are not always able to ensure that we have a secure connection.  By engaging in this virtual visit, you consent to the provision of healthcare and authorize for your insurance to be billed (if applicable) for the services provided during this visit. Depending on your insurance coverage, you may receive a charge related to this service.  I need to obtain your verbal consent now. Are you willing to proceed with your visit today? Mary Mckinney has provided verbal consent on 04/17/2023 for a virtual visit (video or telephone). Margaretann Loveless, PA-C  Date: 04/17/2023 8:02 AM   Virtual Visit via Video Note   I, Margaretann Loveless, connected with  Mary Mckinney  ([id number], 02/04/1985) on 04/17/23 at  8:00 AM EDT by a video-enabled telemedicine application and verified that I am speaking with the correct person using two identifiers.  Location: Patient: Virtual Visit Location  Patient: Home Provider: Virtual Visit Location Provider: Home Office   I discussed the limitations of evaluation and management by telemedicine and the availability of in person appointments. The patient expressed understanding and agreed to proceed.    History of Present Illness: Mary Mckinney is a 38 y.o. who identifies as a female who was assigned female at birth, and is being seen today for dental pain and abscess.  HPI: Dental Pain  This is a new problem. The current episode started in the past 7 days. The problem occurs constantly. The problem has been gradually worsening. The pain is moderate. Associated symptoms include facial pain (right side), sinus pressure and thermal sensitivity. Pertinent negatives include no difficulty swallowing or fever. She has tried acetaminophen (orajel mouthrinse) for the symptoms. The treatment provided no relief.  Saw her dentist last year and was told she would most likely need a root canal. Opted for a filling at that time. Now filling has fallen out, tooth is broken and feels like it is barely hanging on, but she has an abscess at the gum line. Has called her dentist to schedule an extraction, but has to have infection treated prior to removal.    Problems:  Patient Active Problem List   Diagnosis Date Noted   SVD (spontaneous vaginal delivery) 07/19/2020   Chronic hypertension 07/18/2020    Allergies: No Known Allergies Medications:  Current Outpatient Medications:    acetaminophen (TYLENOL) 325 MG tablet, Take 2 tablets (650 mg total) by mouth  every 4 (four) hours as needed (for pain scale < 4)., Disp: , Rfl:    cetirizine (ZYRTEC) 10 MG tablet, Take 10 mg daily by mouth., Disp: , Rfl:    fluticasone (FLONASE) 50 MCG/ACT nasal spray, Place 2 sprays daily into both nostrils., Disp: , Rfl:    ibuprofen (ADVIL) 600 MG tablet, Take 1 tablet (600 mg total) by mouth every 6 (six) hours., Disp: 30 tablet, Rfl: 0   Prenatal Vit-Fe Fumarate-FA (PRENATAL  MULTIVITAMIN) TABS tablet, Take 1 tablet by mouth daily at 12 noon., Disp: , Rfl:    promethazine-dextromethorphan (PROMETHAZINE-DM) 6.25-15 MG/5ML syrup, Take 5 mLs by mouth 3 (three) times daily as needed for cough., Disp: 118 mL, Rfl: 0   pseudoephedrine (SUDAFED) 120 MG 12 hr tablet, Take 120 mg 2 (two) times daily by mouth., Disp: , Rfl:   Observations/Objective: Patient is well-developed, well-nourished in no acute distress.  Resting comfortably at home.  Head is normocephalic, atraumatic.  No labored breathing.  Speech is clear and coherent with logical content.  Patient is alert and oriented at baseline.    Assessment and Plan: There are no diagnoses linked to this encounter. - Suspected infection with broken tooth - Augmentin prescribed - Can use ice on outside jaw/cheek for swelling - Can also take tylenol for pain with other medications - Discussed DenTemp putty that can be used to cover a broken tooth - Schedule a follow with a dentist as soon as possible (Can contact [facility] clinic if underinsured or uninsured) - Seek in person evaluation if symptoms fail to improve or if they worsen   Follow Up Instructions: I discussed the assessment and treatment plan with the patient. The patient was provided an opportunity to ask questions and all were answered. The patient agreed with the plan and demonstrated an understanding of the instructions.  A copy of instructions were sent to the patient via MyChart unless otherwise noted below.    The patient was advised to call back or seek an in-person evaluation if the symptoms worsen or if the condition fails to improve as anticipated.    Margaretann Loveless, PA-C

## 2023-04-17 NOTE — Patient Instructions (Signed)
 [name], thank you for joining Margaretann Loveless, PA-C for today's virtual visit.  While this provider is not your primary care provider (PCP), if your PCP is located in our provider database this encounter information will be shared with them immediately following your visit.   A [facility] MyChart account gives you access to today's visit and all your visits, tests, and labs performed at [facility] " click here if you don't have a [name] or go to [email or web address]  Consent: (Patient) Mary Mckinney provided verbal consent for this virtual visit at the beginning of the encounter.  Current Medications:  Current Outpatient Medications:    amoxicillin-clavulanate (AUGMENTIN) 875-125 MG tablet, Take 1 tablet by mouth 2 (two) times daily., Disp: 14 tablet, Rfl: 0   acetaminophen (TYLENOL) 325 MG tablet, Take 2 tablets (650 mg total) by mouth every 4 (four) hours as needed (for pain scale < 4)., Disp: , Rfl:    cetirizine (ZYRTEC) 10 MG tablet, Take 10 mg daily by mouth., Disp: , Rfl:    fluticasone (FLONASE) 50 MCG/ACT nasal spray, Place 2 sprays daily into both nostrils., Disp: , Rfl:    ibuprofen (ADVIL) 600 MG tablet, Take 1 tablet (600 mg total) by mouth every 6 (six) hours., Disp: 30 tablet, Rfl: 0   Prenatal Vit-Fe Fumarate-FA (PRENATAL MULTIVITAMIN) TABS tablet, Take 1 tablet by mouth daily at 12 noon., Disp: , Rfl:    promethazine-dextromethorphan (PROMETHAZINE-DM) 6.25-15 MG/5ML syrup, Take 5 mLs by mouth 3 (three) times daily as needed for cough., Disp: 118 mL, Rfl: 0   pseudoephedrine (SUDAFED) 120 MG 12 hr tablet, Take 120 mg 2 (two) times daily by mouth., Disp: , Rfl:    Medications ordered in this encounter:  Meds ordered this encounter  Medications   amoxicillin-clavulanate (AUGMENTIN) 875-125 MG tablet    Sig: Take 1 tablet by mouth 2 (two) times daily.    Dispense:  14 tablet    Refill:  0    Supervising Provider:   Merrilee Jansky [[id number]]     *If you need refills on other medications prior to your next appointment, please contact your pharmacy*  Follow-Up: Call back or seek an in-person evaluation if the symptoms worsen or if the condition fails to improve as anticipated.  [facility] [phone]  Other Instructions Dental Abscess  A dental abscess is an infection around a tooth that may involve pain, swelling, and a collection of pus, as well as other symptoms. Treatment is important to help with symptoms and to prevent the infection from spreading. The general types of dental abscesses are: Pulpal abscess. This abscess may form from the inner part of the tooth (pulp). Periodontal abscess. This abscess may form from the gum. What are the causes? This condition is caused by a bacterial infection in or around the tooth. It may result from: Severe tooth decay (cavities). Trauma to the tooth, such as a broken or chipped tooth. What increases the risk? This condition is more likely to develop in males. It is also more likely to develop in people who: Have cavities. Have severe gum disease. Eat sugary snacks between meals. Use tobacco products. Have diabetes. Have a weakened disease-fighting system (immune system). Do not brush and care for their teeth regularly. What are the signs or symptoms? Mild symptoms of this condition include: Tenderness. Bad breath. Fever. A bitter taste in the mouth. Pain in and around the infected tooth. Moderate symptoms of this  condition include: Swollen neck glands. Chills. Pus drainage. Swelling and redness around the infected tooth, in the mouth, or in the face. Severe pain in and around the infected tooth. Severe symptoms of this condition include: Difficulty swallowing. Difficulty opening the mouth. Nausea. Vomiting. How is this diagnosed? This condition is diagnosed based on: Your symptoms and your medical and dental history. An  examination of the infected tooth. During the exam, your dental care provider may tap on the infected tooth. You may also need to have X-rays taken of the affected area. How is this treated? This condition is treated by getting rid of the infection. This may be done with: Antibiotic medicines. These may be used in certain situations. Antibacterial mouth rinse. Incision and drainage. This procedure is done by making an incision in the abscess to drain out the pus. Removing pus is the first priority in treating an abscess. A root canal. This may be performed to save the tooth. Your dental care provider accesses the visible part of your tooth (crown) with a drill and removes any infected pulp. Then the space is filled and sealed off. Tooth extraction. The tooth is pulled out if it cannot be saved by other treatment. You may also receive treatment for pain, such as: Acetaminophen or NSAIDs. Gels that contain a numbing medicine. An injection to block the pain near your nerve. Follow these instructions at home: Medicines Take over-the-counter and prescription medicines only as told by your dental care provider. If you were prescribed an antibiotic, take it as told by your dental care provider. Do not stop taking the antibiotic even if you start to feel better. If you were prescribed a gel that contains a numbing medicine, use it exactly as told in the directions. Do not use these gels for children who are younger than 71 years of age. Use an antibacterial mouth rinse as told by your dental care provider. General instructions  Gargle with a mixture of salt and water 3-4 times a day or as needed. To make salt water, completely dissolve -1 tsp (3-6 g) of salt in 1 cup (237 mL) of warm water. Eat a soft diet while your abscess is healing. Drink enough fluid to keep your urine pale yellow. Do not apply heat to the outside of your mouth. Do not use any products that contain nicotine or tobacco. These  products include cigarettes, chewing tobacco, and vaping devices, such as e-cigarettes. If you need help quitting, ask your dental care provider. Keep all follow-up visits. This is important. How is this prevented?  Excellent dental home care, which includes brushing your teeth every morning and night with fluoride toothpaste. Floss one time each day. Get regularly scheduled dental cleanings. Consider having a dental sealant applied on teeth that have deep grooves to prevent cavities. Drink fluoridated water regularly. This includes most tap water. Check the label on bottled water to see if it contains fluoride. Reduce or eliminate sugary drinks. Eat healthy meals and snacks. Wear a mouth guard or face shield to protect your teeth while playing sports. Contact a health care provider if: Your pain is worse and is not helped by medicine. You have swelling. You see pus around the tooth. You have a fever or chills. Get help right away if: Your symptoms suddenly get worse. You have a very bad headache. You have problems breathing or swallowing. You have trouble opening your mouth. You have swelling in your neck or around your eye. These symptoms may represent a serious  problem that is an emergency. Do not wait to see if the symptoms will go away. Get medical help right away. Call your local emergency services (911 in the [place] Do not drive yourself to the hospital. Summary A dental abscess is a collection of pus in or around a tooth that results from an infection. A dental abscess may result from severe tooth decay, trauma to the tooth, or severe gum disease around a tooth. Symptoms include severe pain, swelling, redness, and drainage of pus in and around the infected tooth. The first priority in treating a dental abscess is to drain out the pus. Treatment may also involve removing damage inside the tooth (root canal) or extracting the tooth. This information is not intended to replace advice  given to you by your health care provider. Make sure you discuss any questions you have with your health care provider. Document Revised: 03/22/2020 Document Reviewed: 03/22/2020 Elsevier Patient Education  2024 Elsevier Inc.   If you have been instructed to have an in-person evaluation today at a local Urgent Care facility, please use the link below. It will take you to a list of all of our available [facility], including address, phone number and hours of operation. Please do not delay care.  [facility]  If you or a family member do not have a primary care provider, use the link below to schedule a visit and establish care. When you choose a [facility] primary care physician or advanced practice provider, you gain a long-term partner in health. Find a Primary Care Provider  Learn more about [facility]'s in-office and virtual care options: [facility] - Get Care Now

## 2023-07-08 ENCOUNTER — Other Ambulatory Visit: Payer: Self-pay

## 2023-07-08 ENCOUNTER — Emergency Department (HOSPITAL_BASED_OUTPATIENT_CLINIC_OR_DEPARTMENT_OTHER)
Admission: EM | Admit: 2023-07-08 | Discharge: 2023-07-08 | Disposition: A | Discharge status: Home / Self Care | Attending: Emergency Medicine | Admitting: Emergency Medicine

## 2023-07-08 ENCOUNTER — Encounter (HOSPITAL_BASED_OUTPATIENT_CLINIC_OR_DEPARTMENT_OTHER): Payer: Self-pay

## 2023-07-08 DIAGNOSIS — R0981 Nasal congestion: Secondary | ICD-10-CM | POA: Insufficient documentation

## 2023-07-08 DIAGNOSIS — R110 Nausea: Secondary | ICD-10-CM | POA: Insufficient documentation

## 2023-07-08 DIAGNOSIS — H9203 Otalgia, bilateral: Secondary | ICD-10-CM | POA: Insufficient documentation

## 2023-07-08 LAB — RESP PANEL BY RT-PCR (RSV, FLU A&B, COVID)  RVPGX2
Influenza A by PCR: NEGATIVE
Influenza B by PCR: NEGATIVE
Resp Syncytial Virus by PCR: NEGATIVE
SARS Coronavirus 2 by RT PCR: NEGATIVE

## 2023-07-08 MED ORDER — FLUTICASONE PROPIONATE 50 MCG/ACT NA SUSP: 2.0000 | Freq: Every day | NASAL | 0 refills | Status: AC

## 2023-07-08 NOTE — ED Triage Notes (Signed)
 Pt states she thinks has a sinus infection  Rt. Nare & rt. Ear is clogged per pt. +nausea Has only used natural remedies

## 2023-07-08 NOTE — ED Provider Notes (Signed)
 [place] EMERGENCY DEPARTMENT AT [facility] Provider Note   CSN: [id number] Arrival date & time: 07/08/23  2220     History  Chief Complaint  Patient presents with   Nasal Congestion    Mary Mckinney is a 38 y.o. female.  38 year old female with no past medical history presents to the ED with a chief complaint of nasal congestion, bilateral ear pain and nausea, reports her symptoms have been ongoing for the past 3 days.  Feels that her symptoms are likely worsening.  She has tried some over-the-counter natural remedies without any improvement in symptoms.  She tells me that she has been using Afrin however there has not been any improvement in her nose.  She feels that I cannot breathe so it makes me anxious .  She has not had any fevers.  No chest pain, no sore throat, no shortness of breath.  The history is provided by the patient.       Home Medications Prior to Admission medications   Medication Sig Start Date End Date Taking? Authorizing Provider  fluticasone (FLONASE) 50 MCG/ACT nasal spray Place 2 sprays into both nostrils daily for 5 days. 07/08/23 07/13/23 Yes Woodroe Vogan, PA-C  acetaminophen  (TYLENOL ) 325 MG tablet Take 2 tablets (650 mg total) by mouth every 4 (four) hours as needed (for pain scale < 4). 07/21/20   Grice, Vivian B, CNM  amoxicillin -clavulanate (AUGMENTIN ) 875-125 MG tablet Take 1 tablet by mouth 2 (two) times daily. 04/17/23   Angelia Kelp, PA-C  cetirizine (ZYRTEC) 10 MG tablet Take 10 mg daily by mouth.    [provider]  ibuprofen  (ADVIL ) 600 MG tablet Take 1 tablet (600 mg total) by mouth every 6 (six) hours. 07/21/20   Grice, Vivian B, CNM  Prenatal Vit-Fe Fumarate-FA (PRENATAL MULTIVITAMIN) TABS tablet Take 1 tablet by mouth daily at 12 noon. 07/22/20   Grice, Vivian B, CNM  promethazine -dextromethorphan (PROMETHAZINE -DM) 6.25-15 MG/5ML syrup Take 5 mLs by mouth 3 (three) times daily as needed for cough. 06/18/21   Lanetta Pion, NP  pseudoephedrine (SUDAFED) 120 MG 12 hr tablet Take 120 mg 2 (two) times daily by mouth.    [provider]      Allergies    Patient has no known allergies.    Review of Systems   Review of Systems  Constitutional:  Negative for fever.  HENT:  Positive for congestion and sinus pressure. Negative for sinus pain and sore throat.   Respiratory:  Negative for wheezing.     Physical Exam Updated Vital Signs BP (!) 145/113 (BP Location: Left Arm)   Pulse (!) 108   Temp 98.3 F (36.8 C) (Temporal)   Resp 18   Ht 5' 4 (1.626 m)   Wt 136.1 kg   SpO2 100%   BMI 51.49 kg/m  Physical Exam Vitals and nursing note reviewed.  Constitutional:      Appearance: Normal appearance. She is obese.  HENT:     Head: Normocephalic and atraumatic.     Ears:     Comments: Bilateral ears without any injection, bulging or erythema. Cardiovascular:     Rate and Rhythm: Normal rate.  Pulmonary:     Effort: Pulmonary effort is normal.     Breath sounds: No wheezing.     Comments: Lungs are clear to auscultation with no wheezing, rhonchi or rales. Abdominal:     General: Abdomen is flat.  Musculoskeletal:     Cervical back: Normal range  of motion and neck supple.  Neurological:     Mental Status: She is alert.     ED Results / Procedures / Treatments   Labs (all labs ordered are listed, but only abnormal results are displayed) Labs Reviewed  RESP PANEL BY RT-PCR (RSV, FLU A&B, COVID)  [id number]    EKG None  Radiology No results found.  Procedures Procedures    Medications Ordered in ED Medications - No data to display  ED Course/ Medical Decision Making/ A&P                                 Medical Decision Making   Patient presents to the ED with a chief complaint of nasal congestion, bilateral ear pain, feeling like she has another sinus infection.  Her last 1 was in the month of November last year.  Patient reports that she has been trying some  natural remedies without any improvement in symptoms.  She also tells me that she has had Afrin but that has not been helping her.  She feels that she needs fluids at this time, however this of a water bottle at the bedside not actively vomiting, no diarrhea.  Her lungs are clear to auscultation, oropharynx is erythematous without any tonsillar exudate or PTA noted.  She is overall nontoxic-appearing, we discussed appropriate treatment with over-the-counter medication likely viral illness.  Also suggested Flonase prescription along with some Zyrtec's.  Patient is agreeable to plan and treatment, return precautions discussed at length.  Stable for discharge.   Portions of this note were generated with Scientist, clinical (histocompatibility and immunogenetics). Dictation errors may occur despite best attempts at proofreading.   Final Clinical Impression(s) / ED Diagnoses Final diagnoses:  Nasal congestion    Rx / DC Orders ED Discharge Orders          Ordered    fluticasone (FLONASE) 50 MCG/ACT nasal spray  Daily        07/08/23 2306              Kerah Hardebeck, PA-C 07/08/23 2311    Ninetta Basket, MD 07/09/23 1601

## 2023-07-08 NOTE — Discharge Instructions (Signed)
 You were given a prescription for Flonase, please use this as prescribed.  In addition, you may also take Benadryl  or Zyrtec to help with your congestion.
